# Patient Record
Sex: Female | Born: 1945 | Race: Asian | Hispanic: No | Marital: Married | Smoking: Never smoker
Health system: Southern US, Community
[De-identification: ages and names within clinical notes are randomized; demographics above are authoritative.]

## PROBLEM LIST (undated history)

## (undated) DIAGNOSIS — E119 Type 2 diabetes mellitus without complications: Secondary | ICD-10-CM

## (undated) HISTORY — PX: BREAST BIOPSY: SHX20

## (undated) HISTORY — PX: ABDOMINAL HYSTERECTOMY: SHX81

## (undated) HISTORY — PX: BREAST SURGERY: SHX581

---

## 2009-12-26 ENCOUNTER — Ambulatory Visit: Payer: Self-pay | Admitting: Family Medicine

## 2010-12-22 ENCOUNTER — Inpatient Hospital Stay: Payer: Self-pay | Admitting: Internal Medicine

## 2010-12-31 ENCOUNTER — Ambulatory Visit: Payer: Self-pay | Admitting: Family Medicine

## 2012-01-06 ENCOUNTER — Ambulatory Visit: Payer: Self-pay | Admitting: Family Medicine

## 2012-12-15 ENCOUNTER — Ambulatory Visit: Payer: Self-pay | Admitting: Ophthalmology

## 2012-12-15 LAB — HEMOGLOBIN: HGB: 10.7 g/dL — ABNORMAL LOW (ref 12.0–16.0)

## 2013-01-04 ENCOUNTER — Ambulatory Visit: Payer: Self-pay | Admitting: Ophthalmology

## 2013-02-03 ENCOUNTER — Ambulatory Visit: Payer: Self-pay | Admitting: Ophthalmology

## 2013-02-16 ENCOUNTER — Ambulatory Visit: Payer: Self-pay | Admitting: Ophthalmology

## 2013-04-12 ENCOUNTER — Ambulatory Visit: Payer: Self-pay | Admitting: Family Medicine

## 2014-05-31 ENCOUNTER — Ambulatory Visit: Payer: Self-pay | Admitting: Family Medicine

## 2014-12-22 NOTE — Op Note (Signed)
PATIENT NAME:  Sandra Holmes, Sandra Holmes MR#:  [id number] DATE OF BIRTH:  10/31/1945  DATE OF PROCEDURE:  02/16/2013  PREOPERATIVE DIAGNOSIS: Visually significant cataract of the left eye.   POSTOPERATIVE DIAGNOSIS: Visually significant cataract of the left eye.   OPERATIVE PROCEDURE: Cataract extraction by phacoemulsification with implant of intraocular lens to left eye.   SURGEON: Galen ManilaWilliam Judy Goodenow, MD.   ANESTHESIA:  1. Managed anesthesia care.  2. Topical tetracaine drops followed by 2% Xylocaine jelly applied in the preoperative holding area.   COMPLICATIONS: None.   TECHNIQUE:  Stop-and-chop  DESCRIPTION OF PROCEDURE: The patient was examined and consented in the preoperative holding area where the aforementioned topical anesthesia was applied to the left eye and then brought back to the Operating Room where the left eye was prepped and draped in the usual sterile ophthalmic fashion and a lid speculum was placed. A paracentesis was created with the side port blade and the anterior chamber was filled with viscoelastic. A near clear corneal incision was performed with the steel keratome. A continuous curvilinear capsulorrhexis was performed with a cystotome followed by the capsulorrhexis forceps. Hydrodissection and hydrodelineation were carried out with BSS on a blunt cannula. The lens was removed in a stop-and-chop technique and the remaining cortical material was removed with the irrigation-aspiration handpiece. The capsular bag was inflated with viscoelastic and the ZCB00 20.5-diopter lens, serial number [id number] was placed in the capsular bag without complication. The remaining viscoelastic was removed from the eye with the irrigation-aspiration handpiece. The wounds were hydrated. The anterior chamber was flushed with Miostat and the eye was inflated to physiologic pressure. 0.1 mL of cefuroxime concentration 10 mg/mL was placed in the anterior chamber. The wounds were found to be water tight.  The eye was dressed with Vigamox and Combigan. The patient was given protective glasses to wear throughout the day and a shield with which to sleep tonight. The patient was also given drops with which to begin a drop regimen today and will follow-up with me in one day.    ____________________________ Jerilee FieldWilliam L. Hiilei Gerst, MD wlp:ce D: 02/16/2013 12:27:00 ET T: 02/16/2013 15:33:07 ET JOB#: [id number]  cc: Donavan Kerlin L. Jalissa Heinzelman, MD, <Dictator>  Jerilee FieldWILLIAM L Nakia Koble MD ELECTRONICALLY SIGNED 02/17/2013 14:40

## 2014-12-22 NOTE — Op Note (Signed)
PATIENT NAME:  Sandra Holmes, Sandra Holmes MR#:  [id number] DATE OF BIRTH:  02-21-1946  DATE OF PROCEDURE:  01/04/2013  LOCATION:  [facility].    PREOPERATIVE DIAGNOSIS: Visually significant cataract of the right eye.   POSTOPERATIVE DIAGNOSIS: Visually significant cataract of the right eye.   OPERATIVE PROCEDURE: Cataract extraction by phacoemulsification with implant of intraocular lens to right eye.   SURGEON: Galen ManilaWilliam Fawzi Melman, MD.   ANESTHESIA:  1. Managed anesthesia care.  2. Topical tetracaine drops followed by 2% Xylocaine jelly applied in the preoperative holding area.   COMPLICATIONS: None.   TECHNIQUE:  Stop and chop.   DESCRIPTION OF PROCEDURE: The patient was examined and consented in the preoperative holding area where the aforementioned topical anesthesia was applied to the right eye and then brought back to the Operating Room where the right eye was prepped and draped in the usual sterile ophthalmic fashion and a lid speculum was placed. A paracentesis was created with the side port blade and the anterior chamber was filled with viscoelastic. A near clear corneal incision was performed with the steel keratome. A continuous curvilinear capsulorrhexis was performed with a cystotome followed by the capsulorrhexis forceps. Hydrodissection and hydrodelineation were carried out with BSS on a blunt cannula. The lens was removed in a stop and chop technique and the remaining cortical material was removed with the irrigation-aspiration handpiece. The capsular bag was inflated with viscoelastic and the Tecnis ZCB00 21.5-diopter lens, serial number [id number] was placed in the capsular bag without complication. The remaining viscoelastic was removed from the eye with the irrigation-aspiration handpiece. The wounds were hydrated. The anterior chamber was flushed with Miostat and the eye was inflated to physiologic pressure. 0.1 mL of cefuroxime concentration 10 mg/mL was placed in  the anterior chamber. The wounds were found to be water tight. The eye was dressed with Vigamox. The patient was given protective glasses to wear throughout the day and a shield with which to sleep tonight. The patient was also given drops with which to begin a drop regimen today and will follow-up with me in one day.    ____________________________ Jerilee FieldWilliam L. Leondre Taul, MD wlp:ea D: 01/04/2013 19:31:40 ET T: 01/05/2013 04:45:11 ET JOB#: [id number]  cc: Journee Kohen L. Rubin Dais, MD, <Dictator> Jerilee FieldWILLIAM L Curlie Macken MD ELECTRONICALLY SIGNED 01/10/2013 14:35

## 2015-03-26 ENCOUNTER — Ambulatory Visit
Admission: RE | Admit: 2015-03-26 | Discharge: 2015-03-26 | Disposition: A | Discharge status: Home / Self Care | Payer: Medicare Other | Source: Ambulatory Visit | Attending: Gastroenterology | Admitting: Gastroenterology

## 2015-03-26 ENCOUNTER — Ambulatory Visit: Payer: Medicare Other | Admitting: Anesthesiology

## 2015-03-26 ENCOUNTER — Encounter: Payer: Self-pay | Admitting: *Deleted

## 2015-03-26 ENCOUNTER — Encounter
Admission: RE | Disposition: A | Discharge status: Home / Self Care | Payer: Self-pay | Source: Ambulatory Visit | Attending: Gastroenterology

## 2015-03-26 DIAGNOSIS — E119 Type 2 diabetes mellitus without complications: Secondary | ICD-10-CM | POA: Insufficient documentation

## 2015-03-26 DIAGNOSIS — K648 Other hemorrhoids: Secondary | ICD-10-CM | POA: Insufficient documentation

## 2015-03-26 DIAGNOSIS — Z1211 Encounter for screening for malignant neoplasm of colon: Secondary | ICD-10-CM | POA: Insufficient documentation

## 2015-03-26 HISTORY — PX: COLONOSCOPY WITH PROPOFOL: SHX5780

## 2015-03-26 HISTORY — DX: Type 2 diabetes mellitus without complications: E11.9

## 2015-03-26 LAB — GLUCOSE, CAPILLARY: Glucose-Capillary: 110 mg/dL — ABNORMAL HIGH (ref 65–99)

## 2015-03-26 SURGERY — COLONOSCOPY WITH PROPOFOL
Anesthesia: General

## 2015-03-26 MED ORDER — PROPOFOL INFUSION 10 MG/ML OPTIME
INTRAVENOUS | Status: DC | PRN
  Administered 2015-03-26: 100 ug/kg/min via INTRAVENOUS

## 2015-03-26 MED ORDER — SODIUM CHLORIDE 0.9 % IV SOLN: INTRAVENOUS | Status: DC

## 2015-03-26 MED ORDER — MIDAZOLAM HCL 5 MG/5ML IJ SOLN
INTRAMUSCULAR | Status: DC | PRN
  Administered 2015-03-26: 1 mg via INTRAVENOUS

## 2015-03-26 MED ORDER — SODIUM CHLORIDE 0.9 % IV SOLN
INTRAVENOUS | Status: DC
  Administered 2015-03-26: 1000 mL via INTRAVENOUS

## 2015-03-26 MED ORDER — PROPOFOL 10 MG/ML IV BOLUS
INTRAVENOUS | Status: DC | PRN
  Administered 2015-03-26: 50 mg via INTRAVENOUS
  Administered 2015-03-26: 42 mg via INTRAVENOUS

## 2015-03-26 MED ORDER — FENTANYL CITRATE (PF) 100 MCG/2ML IJ SOLN
INTRAMUSCULAR | Status: DC | PRN
  Administered 2015-03-26: 50 ug via INTRAVENOUS

## 2015-03-26 NOTE — H&P (Signed)
  Primary Care Physician:  Juluis Pitch, MD  Pre-Procedure History & Physical: HPI:  Sandra Holmes is a 69 y.o. female is here for an colonoscopy.   Past Medical History  Diagnosis Date  . Diabetes mellitus without complication     Past Surgical History  Procedure Laterality Date  . Abdominal hysterectomy    . Breast surgery      Prior to Admission medications   Medication Sig Start Date End Date Taking? Authorizing Provider  gabapentin (NEURONTIN) 800 MG tablet Take 800 mg by mouth 3 (three) times daily.   Yes Historical Provider, MD  pioglitazone-metformin (ACTOPLUS MET) 15-850 MG per tablet Take 1 tablet by mouth 2 (two) times daily with a meal.   Yes Historical Provider, MD  simvastatin (ZOCOR) 40 MG tablet Take 40 mg by mouth daily.   Yes Historical Provider, MD  traMADol-acetaminophen (ULTRACET) 37.5-325 MG per tablet Take 1 tablet by mouth every 6 (six) hours as needed.   Yes Historical Provider, MD  vitamin B-12 (CYANOCOBALAMIN) 1000 MCG tablet Take 1,000 mcg by mouth daily.   Yes Historical Provider, MD    Allergies as of 02/20/2015  . (Not on File)    History reviewed. No pertinent family history.  History   Social History  . Marital Status: Married    Spouse Name: N/A  . Number of Children: N/A  . Years of Education: N/A   Occupational History  . Not on file.   Social History Main Topics  . Smoking status: Never Smoker   . Smokeless tobacco: Not on file  . Alcohol Use: No  . Drug Use: No  . Sexual Activity: Not on file   Other Topics Concern  . Not on file   Social History Narrative     Physical Exam: BP 172/65 mmHg  Pulse 92  Temp(Src) 98.4 F (36.9 C) (Tympanic)  Resp 16  Ht $R'5\' 4"'xA$  (1.626 m)  Wt 68.04 kg (150 lb)  BMI 25.73 kg/m2  SpO2 98% General:   Alert,  pleasant and cooperative in NAD Head:  Normocephalic and atraumatic. Neck:  Supple; no masses or thyromegaly. Lungs:  Clear throughout to auscultation.    Heart:  Regular  with occ dropped beats, 2/6 systolic murmur Abdomen:  Soft, nontender and nondistended. Normal bowel sounds, without guarding, and without rebound.   Neurologic:  Alert and  oriented x4;  grossly normal neurologically.  Impression/Plan: [name] is here for an colonoscopy to be performed for screening, avg risk  Risks, benefits, limitations, and alternatives regarding  colonoscopy have been reviewed with the patient.  Questions have been answered.  All parties agreeable.   Josefine Class, MD  03/26/2015, 8:48 AM

## 2015-03-26 NOTE — Transfer of Care (Signed)
Immediate Anesthesia Transfer of Care Note  Patient: Sandra Holmes  Procedure(s) Performed: Procedure(s): COLONOSCOPY WITH PROPOFOL (N/A)  Patient Location: PACU  Anesthesia Type:General  Level of Consciousness: sedated  Airway & Oxygen Therapy: Patient Spontanous Breathing and Patient connected to nasal cannula oxygen  Post-op Assessment: Report given to RN and Post -op Vital signs reviewed and stable  Post vital signs: Reviewed and stable  Last Vitals:  Filed Vitals:   03/26/15 0920  BP: 92/39  Pulse:   Temp: 36 C  Resp: 20    Complications: No apparent anesthesia complications

## 2015-03-26 NOTE — Op Note (Signed)
[facility] Patient Name: Sandra Holmes Procedure Date: 03/26/2015 8:43 AM MRN: [id number] Account #: [id number] Date of Birth: 11-29-1945 Admit Type: Outpatient Age: 69 Room: [facility] ENDO ROOM 2 Gender: Female Note Status: Finalized Procedure:         Colonoscopy Indications:       Screening for colorectal malignant neoplasm, Last                     colonoscopy 10 years ago Patient Profile:   This is a 69 year old female. Providers:         Rhona Raider. Shelle Iron, MD Referring MD:      Teena Irani. Terance Hart, MD (Referring MD) Medicines:         Propofol per Anesthesia Complications:     No immediate complications. Procedure:         Pre-Anesthesia Assessment:                    - Prior to the procedure, a History and Physical was                     performed, and patient medications, allergies and                     sensitivities were reviewed. The patient's tolerance of                     previous anesthesia was reviewed.                    After obtaining informed consent, the colonoscope was                     passed under direct vision. Throughout the procedure, the                     patient's blood pressure, pulse, and oxygen saturations                     were monitored continuously. The Olympus CF-Q160AL                     colonoscope (S#. [id number]) was introduced through the anus                     and advanced to the the cecum, identified by appendiceal                     orifice and ileocecal valve. The colonoscopy was performed                     without difficulty. The patient tolerated the procedure                     well. The quality of the bowel preparation was excellent. Findings:      The perianal and digital rectal examinations were normal.      Internal hemorrhoids were found during retroflexion. The hemorrhoids       were Grade I (internal hemorrhoids that do not prolapse).      The exam was otherwise without  abnormality. Impression:        - Internal hemorrhoids.                    - The examination was otherwise normal.                    -  No specimens collected. Recommendation:    - Observe patient in GI recovery unit.                    - High fiber diet.                    - Continue present medications.                    - Repeat colonoscopy in 10 years for screening purposes.                    - Return to referring physician.                    - The findings and recommendations were discussed with the                     patient.                    - The findings and recommendations were discussed with the                     patient's family. Procedure Code(s): --- Professional ---                    [phone], Colonoscopy, flexible; diagnostic, including                     collection of specimen(s) by brushing or washing, when                     performed (separate procedure) CPT copyright 2014 American Medical Association. All rights reserved. The codes documented in this report are preliminary and upon coder review may  be revised to meet current compliance requirements. Kathalene Frames, MD 03/26/2015 9:17:30 AM This report has been signed electronically. Number of Addenda: 0 Note Initiated On: 03/26/2015 8:43 AM Scope Withdrawal Time: 0 hours 13 minutes 13 seconds  Total Procedure Duration: 0 hours 19 minutes 21 seconds       [facility]

## 2015-03-26 NOTE — Anesthesia Preprocedure Evaluation (Signed)
Anesthesia Evaluation  Patient identified by MRN, date of birth, ID band Patient awake    Reviewed: Allergy & Precautions, NPO status , Patient's Chart, lab work & pertinent test results  Airway Mallampati: II  TM Distance: >3 FB Neck ROM: Full    Dental  (+) Upper Dentures, Lower Dentures   Pulmonary          Cardiovascular + Valvular Problems/Murmurs (murmer, no tx)     Neuro/Psych    GI/Hepatic   Endo/Other  diabetes, Type 2, Oral Hypoglycemic Agents  Renal/GU      Musculoskeletal   Abdominal   Peds  Hematology   Anesthesia Other Findings   Reproductive/Obstetrics                             Anesthesia Physical Anesthesia Plan  ASA: III  Anesthesia Plan: General   Post-op Pain Management:    Induction: Intravenous  Airway Management Planned: Nasal Cannula  Additional Equipment:   Intra-op Plan:   Post-operative Plan:   Informed Consent: I have reviewed the patients History and Physical, chart, labs and discussed the procedure including the risks, benefits and alternatives for the proposed anesthesia with the patient or authorized representative who has indicated his/her understanding and acceptance.     Plan Discussed with:   Anesthesia Plan Comments:         Anesthesia Quick Evaluation

## 2015-03-26 NOTE — Discharge Instructions (Signed)

## 2015-03-26 NOTE — Anesthesia Postprocedure Evaluation (Signed)
  Anesthesia Post-op Note  Patient: Sandra Holmes  Procedure(s) Performed: Procedure(s): COLONOSCOPY WITH PROPOFOL (N/A)  Anesthesia type:General  Patient location: PACU  Post pain: Pain level controlled  Post assessment: Post-op Vital signs reviewed, Patient's Cardiovascular Status Stable, Respiratory Function Stable, Patent Airway and No signs of Nausea or vomiting  Post vital signs: Reviewed and stable  Last Vitals:  Filed Vitals:   03/26/15 0930  BP: 107/46  Pulse: 79  Temp:   Resp: 22    Level of consciousness: awake, alert  and patient cooperative  Complications: No apparent anesthesia complications

## 2015-03-27 ENCOUNTER — Encounter: Payer: Self-pay | Admitting: Gastroenterology

## 2015-07-10 ENCOUNTER — Other Ambulatory Visit: Payer: Self-pay | Admitting: Family Medicine

## 2015-07-10 DIAGNOSIS — Z1231 Encounter for screening mammogram for malignant neoplasm of breast: Secondary | ICD-10-CM

## 2015-07-18 ENCOUNTER — Ambulatory Visit
Admission: RE | Admit: 2015-07-18 | Discharge: 2015-07-18 | Disposition: A | Discharge status: Home / Self Care | Payer: Medicare Other | Source: Ambulatory Visit | Attending: Family Medicine | Admitting: Family Medicine

## 2015-07-18 ENCOUNTER — Other Ambulatory Visit: Payer: Self-pay | Admitting: Family Medicine

## 2015-07-18 DIAGNOSIS — Z1231 Encounter for screening mammogram for malignant neoplasm of breast: Secondary | ICD-10-CM

## 2015-10-16 ENCOUNTER — Inpatient Hospital Stay: Payer: Medicare Other

## 2015-10-16 ENCOUNTER — Inpatient Hospital Stay
Admission: EM | Admit: 2015-10-16 | Discharge: 2015-10-19 | DRG: 494 | Disposition: A | Discharge status: Skilled Nursing Facility | Payer: Medicare Other | Attending: Orthopedic Surgery | Admitting: Orthopedic Surgery

## 2015-10-16 ENCOUNTER — Emergency Department: Payer: Medicare Other

## 2015-10-16 ENCOUNTER — Encounter: Payer: Self-pay | Admitting: Medical Oncology

## 2015-10-16 DIAGNOSIS — Z9889 Other specified postprocedural states: Secondary | ICD-10-CM

## 2015-10-16 DIAGNOSIS — E7800 Pure hypercholesterolemia, unspecified: Secondary | ICD-10-CM | POA: Diagnosis present

## 2015-10-16 DIAGNOSIS — Z7984 Long term (current) use of oral hypoglycemic drugs: Secondary | ICD-10-CM

## 2015-10-16 DIAGNOSIS — D649 Anemia, unspecified: Secondary | ICD-10-CM | POA: Diagnosis present

## 2015-10-16 DIAGNOSIS — Z79899 Other long term (current) drug therapy: Secondary | ICD-10-CM

## 2015-10-16 DIAGNOSIS — E1140 Type 2 diabetes mellitus with diabetic neuropathy, unspecified: Secondary | ICD-10-CM | POA: Diagnosis present

## 2015-10-16 DIAGNOSIS — S82851A Displaced trimalleolar fracture of right lower leg, initial encounter for closed fracture: Principal | ICD-10-CM | POA: Diagnosis present

## 2015-10-16 DIAGNOSIS — Y9289 Other specified places as the place of occurrence of the external cause: Secondary | ICD-10-CM | POA: Diagnosis not present

## 2015-10-16 DIAGNOSIS — I10 Essential (primary) hypertension: Secondary | ICD-10-CM | POA: Diagnosis present

## 2015-10-16 DIAGNOSIS — S82891A Other fracture of right lower leg, initial encounter for closed fracture: Secondary | ICD-10-CM | POA: Diagnosis present

## 2015-10-16 DIAGNOSIS — Z8781 Personal history of (healed) traumatic fracture: Secondary | ICD-10-CM

## 2015-10-16 DIAGNOSIS — W010XXA Fall on same level from slipping, tripping and stumbling without subsequent striking against object, initial encounter: Secondary | ICD-10-CM | POA: Diagnosis present

## 2015-10-16 DIAGNOSIS — S82892A Other fracture of left lower leg, initial encounter for closed fracture: Secondary | ICD-10-CM

## 2015-10-16 LAB — BASIC METABOLIC PANEL
Anion gap: 8 (ref 5–15)
BUN: 15 mg/dL (ref 6–20)
CALCIUM: 9.1 mg/dL (ref 8.9–10.3)
CO2: 30 mmol/L (ref 22–32)
Chloride: 101 mmol/L (ref 101–111)
Creatinine, Ser: 0.87 mg/dL (ref 0.44–1.00)
GFR calc Af Amer: >60 mL/min (ref >60)
GLUCOSE: 124 mg/dL — AB (ref 65–99)
Potassium: 4.9 mmol/L (ref 3.5–5.1)
Sodium: 139 mmol/L (ref 135–145)

## 2015-10-16 LAB — CBC WITH DIFFERENTIAL/PLATELET
BASOS ABS: 0 10*3/uL (ref 0–0.1)
BASOS PCT: 1 %
EOS ABS: 0.7 10*3/uL (ref 0–0.7)
EOS PCT: 10 %
HEMATOCRIT: 30.9 % — AB (ref 35.0–47.0)
Hemoglobin: 9.9 g/dL — ABNORMAL LOW (ref 12.0–16.0)
Lymphocytes Relative: 25 %
Lymphs Abs: 1.6 10*3/uL (ref 1.0–3.6)
MCH: 25.8 pg — ABNORMAL LOW (ref 26.0–34.0)
MCHC: 32.1 g/dL (ref 32.0–36.0)
MCV: 80.4 fL (ref 80.0–100.0)
MONO ABS: 0.5 10*3/uL (ref 0.2–0.9)
MONOS PCT: 7 %
Neutro Abs: 3.7 10*3/uL (ref 1.4–6.5)
Neutrophils Relative %: 57 %
PLATELETS: 283 10*3/uL (ref 150–440)
RBC: 3.84 MIL/uL (ref 3.80–5.20)
RDW: 13.6 % (ref 11.5–14.5)
WBC: 6.5 10*3/uL (ref 3.6–11.0)

## 2015-10-16 LAB — GLUCOSE, CAPILLARY: Glucose-Capillary: 116 mg/dL — ABNORMAL HIGH (ref 65–99)

## 2015-10-16 LAB — MRSA PCR SCREENING: MRSA by PCR: NEGATIVE

## 2015-10-16 MED ORDER — CEFAZOLIN SODIUM-DEXTROSE 2-3 GM-% IV SOLR
2.0000 g | Freq: Once | INTRAVENOUS | Status: AC
  Administered 2015-10-17: 2 g via INTRAVENOUS
  Filled 2015-10-16: qty 50

## 2015-10-16 MED ORDER — GABAPENTIN 300 MG PO CAPS
300.0000 mg | ORAL_CAPSULE | Freq: Three times a day (TID) | ORAL | Status: DC
  Administered 2015-10-16 – 2015-10-19 (×6): 300 mg via ORAL
  Filled 2015-10-16 (×7): qty 1

## 2015-10-16 MED ORDER — SIMVASTATIN 40 MG PO TABS
40.0000 mg | ORAL_TABLET | Freq: Every day | ORAL | Status: DC
  Administered 2015-10-18: 40 mg via ORAL
  Filled 2015-10-16: qty 1

## 2015-10-16 MED ORDER — HYDROCODONE-ACETAMINOPHEN 5-325 MG PO TABS
1.0000 | ORAL_TABLET | Freq: Four times a day (QID) | ORAL | Status: DC | PRN
  Administered 2015-10-16 (×2): 1 via ORAL
  Filled 2015-10-16 (×2): qty 1

## 2015-10-16 MED ORDER — PIOGLITAZONE HCL 30 MG PO TABS
15.0000 mg | ORAL_TABLET | Freq: Two times a day (BID) | ORAL | Status: DC
  Administered 2015-10-18 – 2015-10-19 (×3): 15 mg via ORAL
  Filled 2015-10-16: qty 2
  Filled 2015-10-16 (×2): qty 1

## 2015-10-16 MED ORDER — METFORMIN HCL 850 MG PO TABS
850.0000 mg | ORAL_TABLET | Freq: Two times a day (BID) | ORAL | Status: DC
  Administered 2015-10-18 – 2015-10-19 (×3): 850 mg via ORAL
  Filled 2015-10-16 (×7): qty 1

## 2015-10-16 MED ORDER — HYDROCODONE-ACETAMINOPHEN 5-325 MG PO TABS
1.0000 | ORAL_TABLET | ORAL | Status: DC | PRN
  Administered 2015-10-16: 1 via ORAL
  Administered 2015-10-16: 2 via ORAL
  Administered 2015-10-17: 1 via ORAL
  Administered 2015-10-17: 2 via ORAL
  Administered 2015-10-18 – 2015-10-19 (×3): 1 via ORAL
  Administered 2015-10-19: 2 via ORAL
  Filled 2015-10-16 (×4): qty 1
  Filled 2015-10-16 (×2): qty 2
  Filled 2015-10-16: qty 1
  Filled 2015-10-16: qty 2

## 2015-10-16 MED ORDER — MORPHINE SULFATE (PF) 2 MG/ML IV SOLN
0.5000 mg | INTRAVENOUS | Status: DC | PRN
  Filled 2015-10-16: qty 1

## 2015-10-16 MED ORDER — HYDROMORPHONE HCL 1 MG/ML IJ SOLN
1.0000 mg | Freq: Once | INTRAMUSCULAR | Status: AC
  Administered 2015-10-16: 1 mg via INTRAMUSCULAR
  Filled 2015-10-16: qty 1

## 2015-10-16 MED ORDER — MORPHINE SULFATE (PF) 2 MG/ML IV SOLN
2.0000 mg | INTRAVENOUS | Status: DC | PRN
  Administered 2015-10-16 – 2015-10-18 (×9): 2 mg via INTRAVENOUS
  Filled 2015-10-16 (×8): qty 1

## 2015-10-16 NOTE — ED Provider Notes (Signed)
[facility] Emergency Department Provider Note  ____________________________________________  Time seen: Approximately 8:28 AM  I have reviewed the triage vital signs and the nursing notes.   HISTORY  Chief Complaint Ankle Pain    HPI Sandra Holmes is a 70 y.o. female patient states she tripped over a curb and twisted her ankles. Patient denies any head injury or LOC. Patient also states that the right ankle is worse than the left with obvious edema to the right ankle. Patient rates her pain as a 10 over 10 and describes the pain as "sharp ". No palliative measures taken prior to arrival.   Past Medical History  Diagnosis Date  . Diabetes mellitus without complication ([place])     There are no active problems to display for this patient.   Past Surgical History  Procedure Laterality Date  . Abdominal hysterectomy    . Breast surgery    . Colonoscopy with propofol N/A 03/26/2015    Procedure: COLONOSCOPY WITH PROPOFOL;  Surgeon: Josefine Class, MD;  Location: [facility] ENDOSCOPY;  Service: Endoscopy;  Laterality: N/A;  . Breast biopsy Right 5+ yrs ago    excisional - neg    Current Outpatient Rx  Name  Route  Sig  Dispense  Refill  . gabapentin (NEURONTIN) 800 MG tablet   Oral   Take 800 mg by mouth 3 (three) times daily.         . pioglitazone-metformin (ACTOPLUS MET) 15-850 MG per tablet   Oral   Take 1 tablet by mouth 2 (two) times daily with a meal.         . simvastatin (ZOCOR) 40 MG tablet   Oral   Take 40 mg by mouth daily.         . traMADol-acetaminophen (ULTRACET) 37.5-325 MG per tablet   Oral   Take 1 tablet by mouth every 6 (six) hours as needed.         . vitamin B-12 (CYANOCOBALAMIN) 1000 MCG tablet   Oral   Take 1,000 mcg by mouth daily.           Allergies Review of patient's allergies indicates no known allergies.  Family History  Problem Relation Age of Onset  . Breast cancer Neg Hx     Social  History Social History  Substance Use Topics  . Smoking status: Never Smoker   . Smokeless tobacco: None  . Alcohol Use: No    Review of Systems Constitutional: No fever/chills Eyes: No visual changes. ENT: No sore throat. Cardiovascular: Denies chest pain. Respiratory: Denies shortness of breath. Gastrointestinal: No abdominal pain.  No nausea, no vomiting.  No diarrhea.  No constipation. Genitourinary: Negative for dysuria. Musculoskeletal: Bilateral ankle pain Skin: Negative for rash. Neurological: Negative for headaches, focal weakness or numbness. Endocrine:Diabetes  ____________________________________________   PHYSICAL EXAM:  VITAL SIGNS: ED Triage Vitals  Enc Vitals Group     BP 10/16/15 0821 148/73 mmHg     Pulse Rate 10/16/15 0821 81     Resp 10/16/15 0821 17     Temp 10/16/15 0821 97.6 F (36.4 C)     Temp Source 10/16/15 0821 Oral     SpO2 10/16/15 0821 96 %     Weight 10/16/15 0821 150 lb (68.04 kg)     Height 10/16/15 0821 _0  (1.626 m)     Head Cir --      Peak Flow --      Pain Score 10/16/15 0821 10  Pain Loc --      Pain Edu? --      Excl. in [place]? --     Constitutional: Alert and oriented. Well appearing and in no acute distress. Eyes: Conjunctivae are normal. PERRL. EOMI. Head: Atraumatic. Nose: No congestion/rhinnorhea. Mouth/Throat: Mucous membranes are moist.  Oropharynx non-erythematous. Neck: No stridor.  No cervical spine tenderness to palpation. Hematological/Lymphatic/Immunilogical: No cervical lymphadenopathy. Cardiovascular: Normal rate, regular rhythm. Grossly normal heart sounds.  Good peripheral circulation. Respiratory: Normal respiratory effort.  No retractions. Lungs CTAB. Gastrointestinal: Soft and nontender. No distention. No abdominal bruits. No CVA tenderness. Musculoskeletal: No lower extremity tenderness nor edema.  No joint effusions. Neurologic:  Normal speech and language. No gross focal neurologic deficits are  appreciated. No gait instability. Skin:  Skin is warm, dry and intact. No rash noted. Psychiatric: Mood and affect are normal. Speech and behavior are normal.  ____________________________________________   LABS (all labs ordered are listed, but only abnormal results are displayed)  Labs Reviewed  BASIC METABOLIC PANEL  CBC WITH DIFFERENTIAL/PLATELET   ____________________________________________  EKG   ____________________________________________  RADIOLOGY I, Sable Feil, personally viewed and evaluated these images (plain radiographs) as part of my medical decision making, as well as reviewing the written report by the radiologist. Trimalleolar fracture right ankle. Nondisplaced lateral malleolar fracture left ankle. ____________________________________________   PROCEDURES  Procedure(s) performed: None  Critical Care performed: No  ____________________________________________   INITIAL IMPRESSION / ASSESSMENT AND PLAN / ED COURSE  Pertinent labs & imaging results that were available during my care of the patient were reviewed by me and considered in my medical decision making (see chart for details). Discussed x-ray findings with patient and husband. Discussed with Dr. Rudene Christians on call orthopedics who've admit the patient under his services. ____________________________________________   FINAL CLINICAL IMPRESSION(S) / ED DIAGNOSES  Final diagnoses:  Trimalleolar fracture of right ankle, closed, initial encounter      Sable Feil, PA-C 10/16/15 1004  Lavonia Drafts, MD 10/16/15 [phone]

## 2015-10-16 NOTE — ED Notes (Signed)
Pt reports she tripped over curb and twisted rt ankle. Swelling noted to area. Pt denies hitting head/LOC. Pt also states that he left ankle is painful also.

## 2015-10-16 NOTE — Progress Notes (Signed)
Pt will not take any medications from our pharmacy that is part of her regular medication routine. She will take PRN meds ie pain meds, abx etc. Museum/gallery conservator as well as pharmacy Barbara [facility]) have been made aware.

## 2015-10-16 NOTE — NC FL2 (Signed)
   MEDICAID FL2 LEVEL OF CARE SCREENING TOOL     IDENTIFICATION  Patient Name: Sandra Holmes Birthdate: 12-Apr-1946 Sex: female Admission Date (Current Location): 10/16/2015  [place] and [place] Number:  Chiropodist and Address:  [facility], [address] [place], [place] [address]      Provider Number: [id number]  Attending Physician Name and Address:  Kennedy Bucker, MD  Relative Name and Phone Number:       Current Level of Care: Hospital Recommended Level of Care: Skilled Nursing Facility Prior Approval Number:    Date Approved/Denied:   PASRR Number:  [id number])  Discharge Plan: SNF    Current Diagnoses: Patient Active Problem List   Diagnosis Date Noted  . Bilateral ankle fractures 10/16/2015   Anemia, unspecified 04/13/2015  Hypertension 06/19/2014  Type 2 diabetes mellitus (CMS-HCC) 06/19/2014  Hyperlipidemia, unspecified 06/19/2014  Fibromyalgia      Orientation RESPIRATION BLADDER Height & Weight     Self, Time, Situation, Place  Normal Continent Weight: 150 lb (68.04 kg) Height:   (162.6 cm)  BEHAVIORAL SYMPTOMS/MOOD NEUROLOGICAL BOWEL NUTRITION STATUS   (None )  (none ) Continent Diet (Diet: Card Modified )  AMBULATORY STATUS COMMUNICATION OF NEEDS Skin   Extensive Assist Verbally Normal                       Personal Care Assistance Level of Assistance  Bathing, Feeding, Dressing Bathing Assistance: Limited assistance Feeding assistance: Independent Dressing Assistance: Limited assistance     Functional Limitations Info  Sight, Hearing, Speech Sight Info: Adequate Hearing Info: Adequate Speech Info: Adequate    SPECIAL CARE FACTORS FREQUENCY  PT (By licensed PT), OT (By licensed OT)     PT Frequency:  (5) OT Frequency:  (5)            Contractures      Additional Factors Info  Code Status Code Status Info:  (Full Code. )             Current Medications  (10/16/2015):  This is the current hospital active medication list Current Facility-Administered Medications  Medication Dose Route Frequency Provider Last Rate Last Dose  . HYDROcodone-acetaminophen (NORCO/VICODIN) 5-325 MG per tablet 1-2 tablet  1-2 tablet Oral Q6H PRN Kennedy Bucker, MD   1 tablet at 10/16/15 1429  . morphine 2 MG/ML injection 0.5 mg  0.5 mg Intravenous Q2H PRN Kennedy Bucker, MD         Discharge Medications: Please see discharge summary for a list of discharge medications.  Relevant Imaging Results:  Relevant Lab Results:   Additional Information  (SSN: [id number])  Haig Prophet, LCSW

## 2015-10-16 NOTE — Care Management (Addendum)
Attempted to meet with patient to discuss discharge planning.  Ortho pending. She does not speak good [place] and asked me if I would speak to her husband which is not present. List of home health agencies left with patient. RNCM will continue to follow.

## 2015-10-16 NOTE — H&P (Signed)
Subjective:   Patient is a 70 y.o. female presents with bilateral ankle pain. Onset of symptoms was sudden this morning when she slipped at [facility] coming off a curb      with unchanged course since that time. The pain is located on the outside of the left ankle and the inside and outside of the right ankle. Patient describes the pain as severe continuous and rated as severe. she was evaluated emergency room admitted for treatment of the right trimalleolar ankle fracture, left lateral malleolus fracture is nondisplaced. Past history includesdiabetes treated with oral medication neuropathy hypertension hypercholesterolemia anemia.  Previous studies include ankle x-ray and CT right ankle.  Patient Active Problem List   Diagnosis Date Noted  . Bilateral ankle fractures 10/16/2015   Past Medical History  Diagnosis Date  . Diabetes mellitus without complication [facility])     Past Surgical History  Procedure Laterality Date  . Abdominal hysterectomy    . Breast surgery    . Colonoscopy with propofol N/A 03/26/2015    Procedure: COLONOSCOPY WITH PROPOFOL;  Surgeon: Josefine Class, MD;  Location: [facility] ENDOSCOPY;  Service: Endoscopy;  Laterality: N/A;  . Breast biopsy Right 5+ yrs ago    excisional - neg    Prescriptions prior to admission  Medication Sig Dispense Refill Last Dose  . gabapentin (NEURONTIN) 800 MG tablet Take 800 mg by mouth 4 (four) times daily.    Past Week at Unknown time  . pioglitazone-metformin (ACTOPLUS MET) 15-850 MG per tablet Take 1 tablet by mouth 2 (two) times daily with a meal.   Past Week at Unknown time  . simvastatin (ZOCOR) 40 MG tablet Take 40 mg by mouth at bedtime.    Past Week at Unknown time  . traMADol-acetaminophen (ULTRACET) 37.5-325 MG per tablet Take 1 tablet by mouth every 6 (six) hours as needed.   Past Week at Unknown time  . vitamin B-12 (CYANOCOBALAMIN) 1000 MCG tablet Take 1,000 mcg by mouth daily.   Past Week at Unknown time   No Known Allergies   Social History  Substance Use Topics  . Smoking status: Never Smoker   . Smokeless tobacco: Not on file  . Alcohol Use: No    Family History  Problem Relation Age of Onset  . Breast cancer Neg Hx     Review of Systems Pertinent items are noted in HPI.  Objective:   Patient Vitals for the past 8 hrs:  BP Temp Temp src Pulse Resp SpO2 Height Weight  10/16/15 1513 (!) 119/43 mmHg 99 F (37.2 C) Oral 99 18 94 % - -  10/16/15 1133 (!) 126/41 mmHg 98.2 F (36.8 C) Oral 99 18 96 % - -  10/16/15 1106 130/78 mmHg - - 78 18 98 % - -  10/16/15 0821 (!) 148/73 mmHg 97.6 F (36.4 C) Oral 81 17 96 % 5' 4" (1.626 m) 68.04 kg (150 lb)     Total I/O In: -  Out: 350 [Urine:350]    BP 119/43 mmHg  Pulse 99  Temp(Src) 99 F (37.2 C) (Oral)  Resp 18  Ht 5' 4" (1.626 m)  Wt 68.04 kg (150 lb)  BMI 25.73 kg/m2  SpO2 94% General appearance: alert and cooperative Head: Normocephalic, without obvious abnormality, atraumatic Lungs: clear to auscultation bilaterally Heart: regular rate and rhythm, S1, S2 normal, no murmur, click, rub or gallop Extremities: Swelling in both ankles, more on the right. Skin is intact. She has trace dorsalis pedis and posterior tibial pulses.  Pulses: Trace dorsalis pedis and posterior tibial bilateral  Assessment:   Active Problems:   Bilateral ankle fractures   PPlan nonoperative treatment of the left ankle just with Ace wrap. Right ankle needs ORIF, as there are intra-articular fragments anteromedially though should be addressed at the time of surgery. There is an anterior lateral fragment that probably needs to be left alone. With regard to the right ankle is unstable fracture pattern and will require ORIF plan on doing that tomorrow. Donald Pore was possible, case were discussed with the patient and her husband with her having 2 ankle fractures she will most likely required skilled care postoperatively and have rehabilitation stay after

## 2015-10-17 ENCOUNTER — Encounter: Payer: Self-pay | Admitting: *Deleted

## 2015-10-17 ENCOUNTER — Inpatient Hospital Stay: Payer: Medicare Other

## 2015-10-17 ENCOUNTER — Inpatient Hospital Stay: Payer: Medicare Other | Admitting: Certified Registered Nurse Anesthetist

## 2015-10-17 ENCOUNTER — Encounter
Admission: EM | Disposition: A | Discharge status: Skilled Nursing Facility | Payer: Self-pay | Source: Home / Self Care | Attending: Orthopedic Surgery

## 2015-10-17 HISTORY — PX: ORIF ANKLE FRACTURE: SHX5408

## 2015-10-17 LAB — CBC
HEMATOCRIT: 30.8 % — AB (ref 35.0–47.0)
HEMOGLOBIN: 10.2 g/dL — AB (ref 12.0–16.0)
MCH: 26.8 pg (ref 26.0–34.0)
MCHC: 33.1 g/dL (ref 32.0–36.0)
MCV: 80.9 fL (ref 80.0–100.0)
Platelets: 279 10*3/uL (ref 150–440)
RBC: 3.81 MIL/uL (ref 3.80–5.20)
RDW: 13.2 % (ref 11.5–14.5)
WBC: 9.9 10*3/uL (ref 3.6–11.0)

## 2015-10-17 LAB — GLUCOSE, CAPILLARY
GLUCOSE-CAPILLARY: 156 mg/dL — AB (ref 65–99)
GLUCOSE-CAPILLARY: 126 mg/dL — AB (ref 65–99)
GLUCOSE-CAPILLARY: 161 mg/dL — AB (ref 65–99)
Glucose-Capillary: 147 mg/dL — ABNORMAL HIGH (ref 65–99)

## 2015-10-17 LAB — CREATININE, SERUM: CREATININE: 0.91 mg/dL (ref 0.44–1.00)

## 2015-10-17 SURGERY — OPEN REDUCTION INTERNAL FIXATION (ORIF) ANKLE FRACTURE
Anesthesia: General | Laterality: Right | Wound class: Clean

## 2015-10-17 MED ORDER — SODIUM CHLORIDE 0.9 % IV SOLN
INTRAVENOUS | Status: DC
  Administered 2015-10-17: 14:00:00 via INTRAVENOUS

## 2015-10-17 MED ORDER — FENTANYL CITRATE (PF) 100 MCG/2ML IJ SOLN: 25.0000 ug | INTRAMUSCULAR | Status: DC | PRN

## 2015-10-17 MED ORDER — MAGNESIUM CITRATE PO SOLN
1.0000 | Freq: Once | ORAL | Status: DC | PRN
Start: 2015-10-17 — End: 2015-10-19

## 2015-10-17 MED ORDER — NEOMYCIN-POLYMYXIN B GU 40-200000 IR SOLN
Status: DC | PRN
  Administered 2015-10-17: 4 mL

## 2015-10-17 MED ORDER — ONDANSETRON HCL 4 MG PO TABS: 4.0000 mg | ORAL_TABLET | Freq: Four times a day (QID) | ORAL | Status: DC | PRN

## 2015-10-17 MED ORDER — SUGAMMADEX SODIUM 200 MG/2ML IV SOLN
INTRAVENOUS | Status: DC | PRN
  Administered 2015-10-17: 136 mg via INTRAVENOUS

## 2015-10-17 MED ORDER — ACETAMINOPHEN 325 MG PO TABS: 650.0000 mg | ORAL_TABLET | Freq: Four times a day (QID) | ORAL | Status: DC | PRN

## 2015-10-17 MED ORDER — ZOLPIDEM TARTRATE 5 MG PO TABS: 5.0000 mg | ORAL_TABLET | Freq: Every evening | ORAL | Status: DC | PRN

## 2015-10-17 MED ORDER — MAGNESIUM HYDROXIDE 400 MG/5ML PO SUSP
30.0000 mL | Freq: Every day | ORAL | Status: DC | PRN
  Administered 2015-10-18: 30 mL via ORAL
  Filled 2015-10-17: qty 30

## 2015-10-17 MED ORDER — OXYCODONE HCL 5 MG PO TABS
5.0000 mg | ORAL_TABLET | ORAL | Status: DC | PRN
  Administered 2015-10-18: 10 mg via ORAL
  Administered 2015-10-18 (×2): 5 mg via ORAL
  Administered 2015-10-19: 10 mg via ORAL
  Filled 2015-10-17: qty 2
  Filled 2015-10-17: qty 1
  Filled 2015-10-17: qty 2
  Filled 2015-10-17: qty 1

## 2015-10-17 MED ORDER — FENTANYL CITRATE (PF) 100 MCG/2ML IJ SOLN
INTRAMUSCULAR | Status: DC | PRN
  Administered 2015-10-17: 100 ug via INTRAVENOUS
  Administered 2015-10-17: 50 ug via INTRAVENOUS
  Administered 2015-10-17: 100 ug via INTRAVENOUS

## 2015-10-17 MED ORDER — CEFAZOLIN SODIUM-DEXTROSE 2-3 GM-% IV SOLR
2.0000 g | Freq: Four times a day (QID) | INTRAVENOUS | Status: AC
  Administered 2015-10-17 – 2015-10-18 (×3): 2 g via INTRAVENOUS
  Filled 2015-10-17 (×3): qty 50

## 2015-10-17 MED ORDER — ENOXAPARIN SODIUM 40 MG/0.4ML ~~LOC~~ SOLN
40.0000 mg | SUBCUTANEOUS | Status: DC
  Administered 2015-10-18 – 2015-10-19 (×2): 40 mg via SUBCUTANEOUS
  Filled 2015-10-17 (×2): qty 0.4

## 2015-10-17 MED ORDER — DOCUSATE SODIUM 100 MG PO CAPS
100.0000 mg | ORAL_CAPSULE | Freq: Two times a day (BID) | ORAL | Status: DC
  Administered 2015-10-17 – 2015-10-19 (×4): 100 mg via ORAL
  Filled 2015-10-17 (×4): qty 1

## 2015-10-17 MED ORDER — METHOCARBAMOL 500 MG PO TABS
500.0000 mg | ORAL_TABLET | Freq: Four times a day (QID) | ORAL | Status: DC | PRN
  Administered 2015-10-17 – 2015-10-19 (×3): 500 mg via ORAL
  Filled 2015-10-17 (×3): qty 1

## 2015-10-17 MED ORDER — NEOMYCIN-POLYMYXIN B GU 40-200000 IR SOLN
Status: AC
  Filled 2015-10-17: qty 4

## 2015-10-17 MED ORDER — ROCURONIUM BROMIDE 100 MG/10ML IV SOLN
INTRAVENOUS | Status: DC | PRN
  Administered 2015-10-17: 35 mg via INTRAVENOUS

## 2015-10-17 MED ORDER — BISACODYL 5 MG PO TBEC: 5.0000 mg | DELAYED_RELEASE_TABLET | Freq: Every day | ORAL | Status: DC | PRN

## 2015-10-17 MED ORDER — ONDANSETRON HCL 4 MG/2ML IJ SOLN
4.0000 mg | Freq: Once | INTRAMUSCULAR | Status: AC | PRN
  Administered 2015-10-17: 4 mg via INTRAVENOUS

## 2015-10-17 MED ORDER — PROPOFOL 10 MG/ML IV BOLUS
INTRAVENOUS | Status: DC | PRN
  Administered 2015-10-17: 120 mg via INTRAVENOUS

## 2015-10-17 MED ORDER — ACETAMINOPHEN 650 MG RE SUPP: 650.0000 mg | Freq: Four times a day (QID) | RECTAL | Status: DC | PRN

## 2015-10-17 MED ORDER — METOCLOPRAMIDE HCL 10 MG PO TABS: 5.0000 mg | ORAL_TABLET | Freq: Three times a day (TID) | ORAL | Status: DC | PRN

## 2015-10-17 MED ORDER — ONDANSETRON HCL 4 MG/2ML IJ SOLN: 4.0000 mg | Freq: Four times a day (QID) | INTRAMUSCULAR | Status: DC | PRN

## 2015-10-17 MED ORDER — METOCLOPRAMIDE HCL 5 MG/ML IJ SOLN: 5.0000 mg | Freq: Three times a day (TID) | INTRAMUSCULAR | Status: DC | PRN

## 2015-10-17 MED ORDER — SODIUM CHLORIDE 0.9 % IV SOLN
INTRAVENOUS | Status: DC
  Administered 2015-10-17 – 2015-10-18 (×2): via INTRAVENOUS

## 2015-10-17 MED ORDER — DIPHENHYDRAMINE HCL 12.5 MG/5ML PO ELIX: 12.5000 mg | ORAL_SOLUTION | ORAL | Status: DC | PRN

## 2015-10-17 MED ORDER — LIDOCAINE HCL (CARDIAC) 20 MG/ML IV SOLN
INTRAVENOUS | Status: DC | PRN
  Administered 2015-10-17: 100 mg via INTRAVENOUS

## 2015-10-17 MED ORDER — METHOCARBAMOL 1000 MG/10ML IJ SOLN: 500.0000 mg | Freq: Four times a day (QID) | INTRAVENOUS | Status: DC | PRN

## 2015-10-17 MED ORDER — FENTANYL CITRATE (PF) 100 MCG/2ML IJ SOLN
INTRAMUSCULAR | Status: AC
  Administered 2015-10-17 (×4): 25 ug
  Filled 2015-10-17: qty 2

## 2015-10-17 MED ORDER — DEXAMETHASONE SODIUM PHOSPHATE 4 MG/ML IJ SOLN
INTRAMUSCULAR | Status: DC | PRN
  Administered 2015-10-17: 10 mg via INTRAVENOUS

## 2015-10-17 SURGICAL SUPPLY — 60 items
BANDAGE ACE 4X5 VEL STRL LF (GAUZE/BANDAGES/DRESSINGS) ×6 IMPLANT
BIT DRILL 2.5X2.75 QC CALB (BIT) ×3 IMPLANT
BIT DRILL 3.5X5.5 QC CALB (BIT) ×3 IMPLANT
BIT DRILL CALIBRATED 2.7 (BIT) ×2 IMPLANT
BIT DRILL CALIBRATED 2.7MM (BIT) ×1
BLADE SURG SZ10 CARB STEEL (BLADE) ×6 IMPLANT
BNDG ESMARK 4X12 TAN STRL LF (GAUZE/BANDAGES/DRESSINGS) ×3 IMPLANT
CANISTER SUCT 1200ML W/VALVE (MISCELLANEOUS) ×3 IMPLANT
CHLORAPREP W/TINT 26ML (MISCELLANEOUS) ×3 IMPLANT
DRAPE FLUOR MINI C-ARM 54X84 (DRAPES) ×3 IMPLANT
DRAPE INCISE IOBAN 66X45 STRL (DRAPES) ×3 IMPLANT
DRAPE U-SHAPE 47X51 STRL (DRAPES) ×3 IMPLANT
DRILL SLEEVE 2.7 DIST TIB (TRAUMA) ×2
DRSG EMULSION OIL 3X8 NADH (GAUZE/BANDAGES/DRESSINGS) ×3 IMPLANT
ELECT CAUTERY BLADE 6.4 (BLADE) ×3 IMPLANT
ELECT REM PT RETURN 9FT ADLT (ELECTROSURGICAL) ×3
ELECTRODE REM PT RTRN 9FT ADLT (ELECTROSURGICAL) ×1 IMPLANT
GAUZE PETRO XEROFOAM 1X8 (MISCELLANEOUS) ×3 IMPLANT
GAUZE SPONGE 4X4 12PLY STRL (GAUZE/BANDAGES/DRESSINGS) ×3 IMPLANT
GLOVE BIOGEL PI IND STRL 9 (GLOVE) ×1 IMPLANT
GLOVE BIOGEL PI INDICATOR 9 (GLOVE) ×2
GLOVE INDICATOR 7.5 STRL GRN (GLOVE) ×3 IMPLANT
GLOVE SURG ORTHO 9.0 STRL STRW (GLOVE) ×3 IMPLANT
GOWN SPECIALTY ULTRA XL (MISCELLANEOUS) ×3 IMPLANT
GOWN STRL REUS W/ TWL LRG LVL3 (GOWN DISPOSABLE) ×1 IMPLANT
GOWN STRL REUS W/TWL LRG LVL3 (GOWN DISPOSABLE) ×2
HEMOVAC 400ML (MISCELLANEOUS) ×3
K-WIRE ACE 1.6X6 (WIRE) ×6
KIT DRAIN HEMOVAC JP 7FR 400ML (MISCELLANEOUS) ×1 IMPLANT
KIT RM TURNOVER STRD PROC AR (KITS) ×3 IMPLANT
KWIRE ACE 1.6X6 (WIRE) ×2 IMPLANT
LABEL OR SOLS (LABEL) ×3 IMPLANT
NS IRRIG 1000ML POUR BTL (IV SOLUTION) ×3 IMPLANT
PACK EXTREMITY ARMC (MISCELLANEOUS) ×3 IMPLANT
PAD ABD DERMACEA PRESS 5X9 (GAUZE/BANDAGES/DRESSINGS) ×6 IMPLANT
PAD CAST CTTN 4X4 STRL (SOFTGOODS) ×2 IMPLANT
PAD PREP 24X41 OB/GYN DISP (PERSONAL CARE ITEMS) ×3 IMPLANT
PADDING CAST COTTON 4X4 STRL (SOFTGOODS) ×4
PLATE LOCK 6H 77 BILAT FIB (Plate) ×3 IMPLANT
SCREW ACE CAN 4.0 40M (Screw) ×6 IMPLANT
SCREW CORTICAL 3.5MM  16MM (Screw) ×6 IMPLANT
SCREW CORTICAL 3.5MM 16MM (Screw) ×3 IMPLANT
SCREW LOCK CORT STAR 3.5X14 (Screw) ×3 IMPLANT
SCREW LOCK CORT STAR 3.5X16 (Screw) ×3 IMPLANT
SCREW NON LOCKING LP 3.5 14MM (Screw) ×3 IMPLANT
SCREW NON LOCKING LP 3.5 16MM (Screw) ×3 IMPLANT
SLEEVE DRILL 2.7 DIST TIB (TRAUMA) ×1 IMPLANT
SPLINT CAST 1 STEP 5X30 WHT (MISCELLANEOUS) IMPLANT
SPONGE LAP 18X18 5 PK (GAUZE/BANDAGES/DRESSINGS) ×3 IMPLANT
STAPLER SKIN PROX 35W (STAPLE) ×3 IMPLANT
STOCKINETTE STRL 6IN 960660 (GAUZE/BANDAGES/DRESSINGS) ×3 IMPLANT
SUT ETHILON 3-0 FS-10 30 BLK (SUTURE) ×3
SUT MNCRL AB 4-0 PS2 18 (SUTURE) ×6 IMPLANT
SUT VIC AB 0 CT1 36 (SUTURE) ×3 IMPLANT
SUT VIC AB 2-0 SH 27 (SUTURE) ×4
SUT VIC AB 2-0 SH 27XBRD (SUTURE) ×2 IMPLANT
SUT VIC AB 3-0 SH 27 (SUTURE) ×2
SUT VIC AB 3-0 SH 27X BRD (SUTURE) ×1 IMPLANT
SUTURE EHLN 3-0 FS-10 30 BLK (SUTURE) ×1 IMPLANT
SYRINGE 10CC LL (SYRINGE) ×3 IMPLANT

## 2015-10-17 NOTE — Progress Notes (Signed)
Patient returned from PACU, had episode of vomiting in elevator.  During change of linens patient very lethargic and hard to arouse.  Vital signs showed oxygen saturations 83% on 2 liters.  Temporarily placed on 4 liters of oxygen.   Encouraged patient to cough and deep breathe with incentive spirometer and oxygen level improved to 98-100%.  Oxygen decreased to 2 liters, continued to observe oxygen level after patient fell back asleep and dropped to 90-91% on 2 liters.  Due to state of sedation increased to 3 liters.

## 2015-10-17 NOTE — Progress Notes (Signed)
Oxygen saturations dropped to 84%, encouraged deep breathing and coughing and use of incentive spirometer and improved to 94%.

## 2015-10-17 NOTE — Anesthesia Postprocedure Evaluation (Signed)
Anesthesia Post Note  Patient: Sandra Holmes  Procedure(s) Performed: Procedure(s) (LRB): OPEN REDUCTION INTERNAL FIXATION (ORIF) ANKLE FRACTURE (Right)  Patient location during evaluation: PACU Anesthesia Type: General Level of consciousness: awake and alert Pain management: pain level controlled Vital Signs Assessment: post-procedure vital signs reviewed and stable Respiratory status: spontaneous breathing, nonlabored ventilation, respiratory function stable and patient connected to nasal cannula oxygen Cardiovascular status: blood pressure returned to baseline and stable Postop Assessment: no signs of nausea or vomiting Anesthetic complications: no    Last Vitals:  Filed Vitals:   10/17/15 1726 10/17/15 1756  BP:  125/44  Pulse: 109 107  Temp: 37.1 C 37.2 C  Resp:  18    Last Pain:  Filed Vitals:   10/17/15 1756  PainSc: 5                  Lenard Simmer

## 2015-10-17 NOTE — OR Nursing (Signed)
Dr Rosita Kea notified of O2sat 83-86% on room air - O2 2liter nasal canula initiated. o2 sat 99-100 on oxygen.

## 2015-10-17 NOTE — Progress Notes (Signed)
Xray of right ankle performed in PACU

## 2015-10-17 NOTE — Anesthesia Procedure Notes (Signed)
Procedure Name: Intubation Date/Time: 10/17/2015 2:47 PM Performed by: Shirlee Limerick, Nonnie Pickney Pre-anesthesia Checklist: Patient identified, Emergency Drugs available, Suction available and Patient being monitored Patient Re-evaluated:Patient Re-evaluated prior to inductionOxygen Delivery Method: Circle system utilized Preoxygenation: Pre-oxygenation with 100% oxygen Intubation Type: IV induction Laryngoscope Size: Mac and 3 Grade View: Grade I Tube type: Oral Tube size: 7.0 mm Number of attempts: 1 Placement Confirmation: ETT inserted through vocal cords under direct vision,  positive ETCO2 and breath sounds checked- equal and bilateral Secured at: 20 cm Tube secured with: Tape Dental Injury: Teeth and Oropharynx as per pre-operative assessment

## 2015-10-17 NOTE — Transfer of Care (Signed)
Immediate Anesthesia Transfer of Care Note  Patient: Sandra Holmes  Procedure(s) Performed: Procedure(s): OPEN REDUCTION INTERNAL FIXATION (ORIF) ANKLE FRACTURE (Right)  Patient Location: PACU  Anesthesia Type:General  Level of Consciousness: sedated  Airway & Oxygen Therapy: Patient Spontanous Breathing and Patient connected to face mask oxygen  Post-op Assessment: Report given to RN and Post -op Vital signs reviewed and stable  Post vital signs: Reviewed and stable  Last Vitals:  Filed Vitals:   10/17/15 1335 10/17/15 1407  BP: 144/55 144/60  Pulse: 103   Temp: 38.2 C   Resp: 16     Complications: No apparent anesthesia complications

## 2015-10-17 NOTE — Progress Notes (Signed)
Patient refuses to take any hospital medications unless as needed meds for pain control or antibiotics.  Patient NPO for surgery.  As previously stated in notes patient adament about using own home medications and refuses to take routine medications here, has her home pills on side table in room.  Previous nurse [facility] and pharmacy of issue.  Explained to patient do not take any of her home medications today until after surgery.

## 2015-10-17 NOTE — Op Note (Signed)
10/16/2015 - 10/17/2015  3:54 PM  PATIENT:  Sandra Holmes  70 y.o. female  PRE-OPERATIVE DIAGNOSIS:  right ankle fracture trimalleolar  POST-OPERATIVE DIAGNOSIS:  right ankle fracture trimalleolar  PROCEDURE:  Procedure(s): OPEN REDUCTION INTERNAL FIXATION (ORIF) ANKLE FRACTURE (Right) medial and lateral malleoli   SURGEON: Leitha Schuller, MD  ASSISTANTS: none ANESTHESIA:   general  EBL:  Total I/O In: -  Out: 175 [Urine:175]  BLOOD ADMINISTERED:none  DRAINS: none   LOCAL MEDICATIONS USED:  NONE  SPECIMEN:  No Specimen  DISPOSITION OF SPECIMEN:  N/A  COUNTS:  YES  TOURNIQUET:   Total Tourniquet Time Documented: Thigh (Right) - 43 minutes Total: Thigh (Right) - 43 minutes   IMPLANTS: Biomet composite locking plate distal fibula multiple screws, 4.0 cannulated screws medially 240 mm   DICTATION: .Dragon Dictation  Patient brought the operating room and after adequate anesthesia was obtained a tourniquet was applied to the upper thigh and a bump to internally rotate the right leg. After prepping and draping in usual sterile fashion, appropriate patient identification and timeout procedures were completed. Tourniquet was raised and a lateral approach is made to the distal fibula. The fracture site was exposed and with a reduction clamp anatomic alignment obtained. The anterior to posterior lag screw was inserted over drilling the proximal cortex. Reduction clamp could be removed and the fracture was essentially anatomic aligned. A 6 hole composite plate was then contoured to fit the distal fibula 3 proximal cortical screws were placed and 2 distal locking screws. This gave essentially anatomic alignment of the fibula in both AP and lateral projections. Going medially and anterior medial approach is made in the joint opened the joint was thoroughly irrigated to remove small bone fragments. Fracture was held in a reduced position and 2 K wires were inserted and 2 40 mm  malleolar screws were inserted over the guidewires getting good compression at the fracture site. Fluoroscopic examination showed anatomic alignment with stable fixation, stable syndesmosis to stress views. The wounds were irrigated and closed with 2-0 Vicryl substantially flow by skin staples. Xeroform 4 x 4's ABDs and web roll and a stirrup splint applied   Plan OF CARE: Continue as inpatient  PATIENT DISPOSITION:  PACU - hemodynamically stable.

## 2015-10-17 NOTE — Anesthesia Preprocedure Evaluation (Signed)
Anesthesia Evaluation  Patient identified by MRN, date of birth, ID band Patient awake    Reviewed: Allergy & Precautions, H&P , NPO status , Patient's Chart, lab work & pertinent test results, reviewed documented beta blocker date and time   History of Anesthesia Complications Negative for: history of anesthetic complications  Airway Mallampati: I  TM Distance: >3 FB Neck ROM: full    Dental no notable dental hx. (+) Edentulous Upper, Edentulous Lower   Pulmonary neg shortness of breath, neg sleep apnea, neg COPD, neg recent URI,  Baseline saturation is 88-90%   Pulmonary exam normal breath sounds clear to auscultation       Cardiovascular Exercise Tolerance: Good (-) angina(-) CAD, (-) Past MI, (-) Cardiac Stents and (-) CABG Normal cardiovascular exam(-) dysrhythmias + Valvular Problems/Murmurs  Rhythm:regular Rate:Normal     Neuro/Psych PSYCHIATRIC DISORDERS (fibromyalgia) negative neurological ROS     GI/Hepatic negative GI ROS, Neg liver ROS,   Endo/Other  diabetes  Renal/GU negative Renal ROS  negative genitourinary   Musculoskeletal   Abdominal   Peds  Hematology negative hematology ROS (+)   Anesthesia Other Findings Past Medical History:   Diabetes mellitus without complication (HCC)                 Reproductive/Obstetrics negative OB ROS                             Anesthesia Physical Anesthesia Plan  ASA: II  Anesthesia Plan: General   Post-op Pain Management:    Induction:   Airway Management Planned:   Additional Equipment:   Intra-op Plan:   Post-operative Plan:   Informed Consent: I have reviewed the patients History and Physical, chart, labs and discussed the procedure including the risks, benefits and alternatives for the proposed anesthesia with the patient or authorized representative who has indicated his/her understanding and acceptance.   Dental  Advisory Given  Plan Discussed with: Anesthesiologist, CRNA and Surgeon  Anesthesia Plan Comments:         Anesthesia Quick Evaluation

## 2015-10-17 NOTE — Care Management Note (Addendum)
Case Management Note  Patient Details  Name: Sandra Holmes MRN: [id number] Date of Birth: 1945-10-26  Subjective/Objective:                  Attempted again to meet with patient to discuss discharge planning- husband was not in room. I left message at number on patient's white board to call this RNCM back to discuss discharge needs. Patient does not want to talk to CM without husband being present.  Action/Plan:  List of home health providers and my contact card left with patient. RNCM will continue to follow.   Expected Discharge Date:                  Expected Discharge Plan:     In-House Referral:     Discharge planning Services     Post Acute Care Choice:  Durable Medical Equipment, Home Health Choice offered to:  Patient, Spouse  DME Arranged:    DME Agency:     HH Arranged:    [place] Agency:     Status of Service:  In process, will continue to follow  Medicare Important Message Given:    Date Medicare IM Given:    Medicare IM give by:    Date Additional Medicare IM Given:    Additional Medicare Important Message give by:     If discussed at [place] of Stay Meetings, dates discussed:    Additional Comments: 10/18/15: met with patient's husband and he agrees to SNF in the [facility] area. List of home health providers shared with him also. She does not have any DME available at home. She uses [facility] on [address] for Rx. PT is recommending SNF.   Marshell Garfinkel, RN 10/17/2015, 11:14 AM

## 2015-10-18 LAB — GLUCOSE, CAPILLARY
GLUCOSE-CAPILLARY: 135 mg/dL — AB (ref 65–99)
GLUCOSE-CAPILLARY: 145 mg/dL — AB (ref 65–99)
GLUCOSE-CAPILLARY: 115 mg/dL — AB (ref 65–99)

## 2015-10-18 MED ORDER — TRAMADOL-ACETAMINOPHEN 37.5-325 MG PO TABS: 1.0000 | ORAL_TABLET | Freq: Four times a day (QID) | ORAL | Status: AC | PRN

## 2015-10-18 MED ORDER — OXYCODONE HCL ER 10 MG PO T12A: 10.0000 mg | EXTENDED_RELEASE_TABLET | Freq: Two times a day (BID) | ORAL | Status: AC

## 2015-10-18 MED ORDER — OXYCODONE HCL ER 10 MG PO T12A
10.0000 mg | EXTENDED_RELEASE_TABLET | Freq: Two times a day (BID) | ORAL | Status: DC
  Administered 2015-10-18 – 2015-10-19 (×2): 10 mg via ORAL
  Filled 2015-10-18 (×2): qty 1

## 2015-10-18 MED ORDER — OXYCODONE HCL 5 MG PO TABS: 5.0000 mg | ORAL_TABLET | ORAL | Status: AC | PRN

## 2015-10-18 MED ORDER — ASPIRIN EC 325 MG PO TBEC: 325.0000 mg | DELAYED_RELEASE_TABLET | Freq: Every day | ORAL | Status: AC

## 2015-10-18 NOTE — Discharge Instructions (Signed)
Diet: As you were doing prior to hospitalization   Shower: Keep cast clean and dry.   Activity:  Increase activity slowly as tolerated, but follow the weight bearing instructions below.  No lifting or driving for 6 weeks.  Weight Bearing:   Weight bearing as tolerated to left lower extremity. Non weight bearing right lower extremity.  To prevent constipation: you may use a stool softener such as -  Colace (over the counter) 100 mg by mouth twice a day  Drink plenty of fluids (prune juice may be helpful) and high fiber foods Miralax (over the counter) for constipation as needed.    Itching:  If you experience itching with your medications, try taking only a single pain pill, or even half a pain pill at a time.  You may take up to 10 pain pills per day, and you can also use benadryl over the counter for itching or also to help with sleep.   Precautions:  If you experience chest pain or shortness of breath - call 911 immediately for transfer to the hospital emergency department!!  If you develop a fever greater that 101 F, purulent drainage from wound, increased redness or drainage from wound, or calf pain-Call [facility]                                               Follow- Up Appointment:  Please call for an appointment to be seen in 12 days at [facility]

## 2015-10-18 NOTE — Clinical Social Work Placement (Signed)
   CLINICAL SOCIAL WORK PLACEMENT  NOTE  Date:  10/18/2015  Patient Details  Name: Sandra Holmes MRN: [id number] Date of Birth: Dec 25, 1945  Clinical Social Work is seeking post-discharge placement for this patient at the Skilled  Nursing Facility level of care (*CSW will initial, date and re-position this form in  chart as items are completed):  Yes   Patient/family provided with [facility] Clinical Social Work Department's list of facilities offering this level of care within the geographic area requested by the patient (or if unable, by the patient's family).  Yes   Patient/family informed of their freedom to choose among providers that offer the needed level of care, that participate in Medicare, Medicaid or managed care program needed by the patient, have an available bed and are willing to accept the patient.  Yes   Patient/family informed of [facility]'s ownership interest in [facility] and [facility], as well as of the fact that they are under no obligation to receive care at these facilities.  PASRR submitted to EDS on 10/18/15     PASRR number received on 10/18/15     Existing PASRR number confirmed on       FL2 transmitted to all facilities in geographic area requested by pt/family on 10/18/15     FL2 transmitted to all facilities within larger geographic area on       Patient informed that his/her managed care company has contracts with or will negotiate with certain facilities, including the following:        Yes   Patient/family informed of bed offers received.  Patient chooses bed at       Physician recommends and patient chooses bed at      Patient to be transferred to   on  .  Patient to be transferred to facility by       Patient family notified on   of transfer.  Name of family member notified:        PHYSICIAN       Additional Comment:    _______________________________________________ Haig Prophet, LCSW 10/18/2015, 9:46 AM

## 2015-10-18 NOTE — Progress Notes (Signed)
Clinical Education officer, museum (CSW) met with patient and presented bed offers. Patient chose [facility]. CSW left patient's husband Alvester Chou a voicemail making him aware of above. Plan is for patient to D/C to [facility] tomorrow (10/19/15) pending medical clearance. [facility] admissions coordinator at [facility] is aware of above. CSW will continue to follow and assist as needed.   Blima Rich, LCSW [phone]

## 2015-10-18 NOTE — Clinical Social Work Note (Signed)
Clinical Social Work Assessment  Patient Details  Name: Sandra Holmes MRN: [id number] Date of Birth: May 19, 1946  Date of referral:  10/18/15               Reason for consult:  Facility Placement                Permission sought to share information with:  Chartered certified accountant granted to share information::  Yes, Verbal Permission Granted  Name::      [place]::   [place]   Relationship::     Contact Information:     Housing/Transportation Living arrangements for the past 2 months:  [place] of Information:  Patient, Spouse Patient Interpreter Needed:  None Criminal Activity/Legal Involvement Pertinent to Current Situation/Hospitalization:  No - Comment as needed Significant Relationships:  Spouse Lives with:  Spouse Do you feel safe going back to the place where you live?  Yes Need for family participation in patient care:  Yes (Comment)  Care giving concerns:  Patient lives in [place] with her husband Sandra Holmes home phone [phone] patient's cell phone [phone].    Social Worker assessment / plan:  Holiday representative (CSW) received SNF consult. PT is recommending SNF. CSW met with patient and her husband Sandra Holmes was at bedside. Patient was alert and oriented and sitting up in the chair. CSW introduced self and explained role of CSW department. Patient can speak and understand english. Patient and husband did speak another language to each other at times throughout assessment. Per patient she lives with her husband in [place]. Patient reported that they have no children. Per patient she sustained her injury at [facility] due to stepping off the curb and not being aware of the curb. Patient reported that her husband's cousin just happen to be standing behind her and caught her head before it hit the ground. Patient was in good spirits today and was optimistic about her recovery. CSW explained  SNF process. Patient and husband are agreeable to SNF search in [place]. SNF list was provided. Husband prefers [facility] [employer] or [facility]. CSW explained Medicare guidelines for SNF and that Medicare will pay for days 1-20 at 100% at SNF. Patient reported that she is not planning on staying 20 days at [facility].   FL2 complete and faxed out. CSW will continue to follow and assist as needed.   Employment status:  Retired Forensic scientist:  Medicare PT Recommendations:  [place] / Referral to community resources:  [place]  Patient/Family's Response to care:  Patient and husband are agreeable to [facility] in [place].   Patient/Family's Understanding of and Emotional Response to Diagnosis, Current Treatment, and Prognosis:  Patient and husband were pleasant throughout assessment and thanked CSW for visit.   Emotional Assessment Appearance:  Appears stated age Attitude/Demeanor/Rapport:    Affect (typically observed):  Accepting, Adaptable, Pleasant Orientation:  Oriented to Self, Oriented to Place, Oriented to  Time, Oriented to Situation Alcohol / Substance use:  Not Applicable Psych involvement (Current and /or in the community):  No (Comment)  Discharge Needs  Concerns to be addressed:  Discharge Planning Concerns Readmission within the last 30 days:  No Current discharge risk:  Dependent with Mobility Barriers to Discharge:  Continued Medical Work up   Loralyn Freshwater, LCSW 10/18/2015, 9:48 AM

## 2015-10-18 NOTE — Evaluation (Signed)
Physical Therapy Evaluation Patient Details Name: ANALLELI GIERKE MRN: [id number] DOB: [birth year] Today's Date: 10/18/2015   History of Present Illness  Patient is a 70 y.o. female presents with bilateral ankle pain. Onset of symptoms was sudden this morning when she slipped at [facility] coming off a curb     with unchanged course since that time. The pain is located on the outside of the left ankle and the inside and outside of the right ankle. Patient describes the pain as severe continuous and rated as severe. she was evaluated emergency room admitted for treatment of the right trimalleolar ankle fracture, left lateral malleolus fracture is nondisplaced. Past history includesdiabetes treated with oral medication neuropathy hypertension hypercholesterolemia anemia. Previous studies include ankle x-ray and CT right ankle.  Clinical Impression  Pt demonstrates severe limitations in mobility due to weakness and pain. She is WBAT on LLE which allows for transfers and ambulation however very limited today due to pain. Pt is very motivated and eager to get better. She is able to complete all bed exercises as instructed and is able to transfer to recliner. Pt will need SNF placement at discharge in order to return to prior level of function. Pt will benefit from skilled PT services to address deficits in strength, balance, and mobility in order to return to full function at home.     Follow Up Recommendations SNF    Equipment Recommendations  Rolling walker with 5" wheels (TBD at SNF)    Recommendations for Other Services Rehab consult     Precautions / Restrictions Precautions Precautions: Fall Restrictions Weight Bearing Restrictions: Yes RLE Weight Bearing: Non weight bearing LLE Weight Bearing: Weight bearing as tolerated      Mobility  Bed Mobility Overal bed mobility: Needs Assistance Bed Mobility: Supine to Sit     Supine to sit: Mod assist     General bed mobility comments:  Pt requires cues and instruction for proper sequencing with bed mobility. Assist to scoot forward to EOB  Transfers Overall transfer level: Needs assistance Equipment used: Rolling walker (2 wheeled) Transfers: Sit to/from Stand Sit to Stand: Mod assist;+2 physical assistance         General transfer comment: Initially pt performs with difficulty straigthening LLE and L foot sliding forward. Repeated transfer second time with repeated cues and foot to block patien'ts leg from sliding. Improved transfer during second attempt. Once upright able to decrease assist to minA+2. Pt able to perform limited hopping in place with WBAT on LLE and NWB RLE. Pt with increase in LLE pain.  Ambulation/Gait Ambulation/Gait assistance: Mod assist;+2 physical assistance;Max assist Ambulation Distance (Feet): 2 Feet Assistive device: Rolling walker (2 wheeled)   Gait velocity: Decreased   General Gait Details: Pt with hopping gait pattern on LLE while maintaining NWB RLE. Initially modA+2 for 2' but increases to maxA+2 with quick redirection toward recliner. Pt is WBAT on LLE but with increased pain in standing. Very limited in ambulation at this time. SaO2 >90% on room air during transfer. Pt placed back on supplemental O2 but weaned down to 2L/min  Stairs            Wheelchair Mobility    Modified Rankin (Stroke Patients Only)       Balance Overall balance assessment: Needs assistance Sitting-balance support: No upper extremity supported Sitting balance-Leahy Scale: Good     Standing balance support: Bilateral upper extremity supported Standing balance-Leahy Scale: Poor  Pertinent Vitals/Pain Pain Assessment: 0-10 Pain Score: 7  Pain Location: Right ankle, 0/10 L ankle at rest. Bilateral ankle pain increases with mobility Pain Intervention(s): Monitored during session;Limited activity within patient's tolerance;RN gave pain meds during session     Home Living Family/patient expects to be discharged to:: Unsure Living Arrangements: Spouse/significant other Available Help at Discharge: Family Type of Home: House Home Access: Stairs to enter Entrance Stairs-Rails: Left Entrance Stairs-Number of Steps: 5 Home Layout: Two level;Able to live on main level with bedroom/bathroom Home Equipment: Cane - single point (no walker, crutches, shower seat, wheelchair, or hospitalbed)      Prior Function Level of Independence: Independent         Comments: Fully independent for community ambulation without assistive device     Hand Dominance   Dominant Hand: Right    Extremity/Trunk Assessment   Upper Extremity Assessment: Overall WFL for tasks assessed           Lower Extremity Assessment: RLE deficits/detail;LLE deficits/detail RLE Deficits / Details: RLE immobilized in splint. LLE immobilized with ace wrap. Pt able to flex extend both toes with intact sensation to light touch and good capillary refill. Pt able to perform bilateral SLR and LAQ but with increased pain. Requires assist for bilateral LE abduction/adduction due to increased pain       Communication   Communication: Prefers language other than English (Speaks adequate English for evaluation but not native)  Cognition Arousal/Alertness: Awake/alert Behavior During Therapy: WFL for tasks assessed/performed Overall Cognitive Status: Within Functional Limits for tasks assessed                      General Comments      Exercises General Exercises - Lower Extremity Quad Sets: Strengthening;Both;10 reps;Supine Gluteal Sets: Strengthening;Both;10 reps;Supine Short Arc Quad: Strengthening;Both;10 reps;Supine Hip ABduction/ADduction: Strengthening;Both;10 reps;Supine Straight Leg Raises: Strengthening;Both;10 reps;Supine      Assessment/Plan    PT Assessment Patient needs continued PT services  PT Diagnosis Difficulty walking;Abnormality of  gait;Generalized weakness;Acute pain   PT Problem List Decreased strength;Decreased range of motion;Decreased activity tolerance;Decreased balance;Decreased mobility;Decreased knowledge of use of DME;Decreased safety awareness;Pain  PT Treatment Interventions DME instruction;Gait training;Stair training;Functional mobility training;Therapeutic activities;Therapeutic exercise;Balance training;Neuromuscular re-education;Patient/family education;Manual techniques   PT Goals (Current goals can be found in the Care Plan section) Acute Rehab PT Goals Patient Stated Goal: Return to prior level of function at home PT Goal Formulation: With patient/family Time For Goal Achievement: 11/01/15 Potential to Achieve Goals: Good    Frequency BID   Barriers to discharge Inaccessible home environment Pt with 5 steps to enter home    Co-evaluation               End of Session Equipment Utilized During Treatment: Gait belt Activity Tolerance: Patient tolerated treatment well Patient left: in chair;with call bell/phone within reach;with chair alarm set           Time: 4098-1191 PT Time Calculation (min) (ACUTE ONLY): 40 min   Charges:   PT Evaluation $PT Eval Moderate Complexity: 1 Procedure PT Treatments $Therapeutic Exercise: 8-22 mins   PT G Codes:       Sharalyn Ink Huprich PT, DPT   Huprich,Jason 10/18/2015, 11:38 AM

## 2015-10-18 NOTE — Progress Notes (Signed)
   Subjective: 1 Day Post-Op Procedure(s) (LRB): OPEN REDUCTION INTERNAL FIXATION (ORIF) ANKLE FRACTURE (Right) Patient reports pain as mild.   Patient is well, and has had no acute complaints or problems Denies any CP, SOB, ABD pain. We will continue therapy today.  Plan is to go Skilled nursing facility after hospital stay.  Objective: Vital signs in last 24 hours: Temp:  [98 F (36.7 C)-100.8 F (38.2 C)] 98 F (36.7 C) (02/16 0232) Pulse Rate:  [88-110] 99 (02/16 0232) Resp:  [15-19] 18 (02/16 0232) BP: (120-150)/(42-73) 131/54 mmHg (02/16 0232) SpO2:  [83 %-100 %] 93 % (02/16 0232) Weight:  [68.04 kg (150 lb)] 68.04 kg (150 lb) (02/15 1335)  Intake/Output from previous day: 02/15 0701 - 02/16 0700 In: 850 [I.V.:850] Out: 1035 [Urine:1025; Blood:10] Intake/Output this shift:     Recent Labs  10/16/15 0959 10/17/15 1859  HGB 9.9* 10.2*    Recent Labs  10/16/15 0959 10/17/15 1859  WBC 6.5 9.9  RBC 3.84 3.81  HCT 30.9* 30.8*  PLT 283 279    Recent Labs  10/16/15 0959 10/17/15 1859  NA 139  --   K 4.9  --   CL 101  --   CO2 30  --   BUN 15  --   CREATININE 0.87 0.91  GLUCOSE 124*  --   CALCIUM 9.1  --    No results for input(s): LABPT, INR in the last 72 hours.  EXAM General - Patient is Alert, Appropriate and Oriented Extremity - Neurovascular intact Sensation intact distally Intact pulses distally Dressing - dressing C/D/I and no drainage Motor Function - intact, moving toes well on exam.   Past Medical History  Diagnosis Date  . Diabetes mellitus without complication (HCC)     Assessment/Plan:   1 Day Post-Op Procedure(s) (LRB): OPEN REDUCTION INTERNAL FIXATION (ORIF) ANKLE FRACTURE (Right) Active Problems:   Bilateral ankle fractures  Estimated body mass index is 25.73 kg/(m^2) as calculated from the following:   Height as of this encounter:  (1.626 m).   Weight as of this encounter: 68.04 kg (150 lb). Advance diet Up with  therapy, NWB RLE, WBAT LLE   DVT Prophylaxis - Lovenox Weight-Bearing as tolerated to left leg, NWB RLE D/C O2 and Pulse OX and try on Room Air  T. Cranston Neighbor, PA-C [facility] 10/18/2015, 7:32 AM

## 2015-10-18 NOTE — Discharge Summary (Signed)
Physician Discharge Summary  Patient ID: Sandra Holmes MRN: [id number] DOB/AGE: 68-12-1945 68 y.o.  Admit date: 10/16/2015 Discharge date: 10/19/2015  Admission Diagnoses:  Hypertension [I10] Trimalleolar fracture of right ankle, closed, initial encounter [S82.851A]   Discharge Diagnoses: Patient Active Problem List   Diagnosis Date Noted  . Bilateral ankle fractures 10/16/2015    Past Medical History  Diagnosis Date  . Diabetes mellitus without complication ([place])      Transfusion: none   Consultants (if any):    Discharged Condition: Improved  Hospital Course: Sandra Holmes is an 70 y.o. female who was admitted 10/16/2015 with a diagnosis of bilateral ankle fractures and went to the operating room on 10/16/2015 - 10/17/2015 and underwent the above named procedures.    Surgeries: Procedure(s): OPEN REDUCTION INTERNAL FIXATION (ORIF) ANKLE FRACTURE on 10/16/2015 - 10/17/2015 Patient tolerated the surgery well. Taken to PACU where she was stabilized and then transferred to the orthopedic floor.  Started on Lovenox 40 q 24 hrs. Foot pumps applied bilaterally at 80 mm. Heels elevated on bed with rolled towels. No evidence of DVT. Negative Homan. Physical therapy started on day #1 for gait training and transfer. OT started day #1 for ADL and assisted devices.  Patient's foley was d/c on day #1. On post op day #2 patient was placed into a right short leg cast. ACE wrap was applied to the left ankle. Patient is NWB RLE, WBAT LLE.  On post op day #2 patient was stable and ready for discharge to SNF.  Implants: Biomet composite locking plate distal fibula multiple screws, 4.0 cannulated screws medially 240 mm  She was given perioperative antibiotics:  Anti-infectives    Start     Dose/Rate Route Frequency Ordered Stop   10/17/15 2100  ceFAZolin (ANCEF) IVPB 2 g/50 mL premix     2 g 100 mL/hr over 30 Minutes Intravenous Every 6 hours 10/17/15 1713 10/18/15 0917   10/17/15  1400  [MAR Hold]  ceFAZolin (ANCEF) IVPB 2 g/50 mL premix     (MAR Hold since 10/17/15 1332)   2 g 100 mL/hr over 30 Minutes Intravenous  Once 10/16/15 1542 10/17/15 1502    .  She was given sequential compression devices, early ambulation, and aspirin for DVT prophylaxis.  She benefited maximally from the hospital stay and there were no complications.    Recent vital signs:  Filed Vitals:   10/18/15 1619 10/18/15 1930  BP: 106/39 120/39  Pulse: 101 96  Temp:  98.1 F (36.7 C)  Resp:  21    Recent laboratory studies:  Lab Results  Component Value Date   HGB 10.2* 10/17/2015   HGB 9.9* 10/16/2015   HGB 10.3* 02/03/2013   Lab Results  Component Value Date   WBC 9.9 10/17/2015   PLT 279 10/17/2015   No results found for: INR Lab Results  Component Value Date   NA 139 10/16/2015   K 4.9 10/16/2015   CL 101 10/16/2015   CO2 30 10/16/2015   BUN 15 10/16/2015   CREATININE 0.91 10/17/2015   GLUCOSE 124* 10/16/2015    Discharge Medications:     Medication List    TAKE these medications        ferrous gluconate 324 MG tablet  Commonly known as:  FERGON  Take 324 mg by mouth daily.     gabapentin 800 MG tablet  Commonly known as:  NEURONTIN  Take 800 mg by mouth 4 (four) times daily.  oxyCODONE 10 mg 12 hr tablet  Commonly known as:  OXYCONTIN  Take 1 tablet (10 mg total) by mouth every 12 (twelve) hours.     oxyCODONE 5 MG immediate release tablet  Commonly known as:  Oxy IR/ROXICODONE  Take 1-2 tablets (5-10 mg total) by mouth every 3 (three) hours as needed for breakthrough pain.     pioglitazone-metformin 15-850 MG tablet  Commonly known as:  ACTOPLUS MET  Take 1 tablet by mouth 2 (two) times daily with a meal.     simvastatin 40 MG tablet  Commonly known as:  ZOCOR  Take 40 mg by mouth at bedtime.     traMADol-acetaminophen 37.5-325 MG tablet  Commonly known as:  ULTRACET  Take 1 tablet by mouth every 6 (six) hours as needed.     vitamin B-12  1000 MCG tablet  Commonly known as:  CYANOCOBALAMIN  Take 1,000 mcg by mouth daily.        Diagnostic Studies: Dg Ankle 2 Views Right  11-13-15  CLINICAL DATA:  Status post open reduction internal fixation right ankle fracture EXAM: RIGHT ANKLE - 2 VIEW COMPARISON:  10/16/2015 FINDINGS: Two views of the right ankle submitted. The patient is status post open reduction internal fixation of trimalleolar ankle fracture. A metallic fixation plate and metallic fixation screws are noted in in distal fibula. Two metallic fixation screws are noted in and distal tibia. There is anatomic alignment. Lateral and medial skin staples are noted. There is casting material artifact. Ankle mortise is restored. IMPRESSION: The patient is status post open reduction internal fixation of trimalleolar ankle fracture. A metallic fixation plate and metallic fixation screws are noted in in distal fibula. Two metallic fixation screws are noted in and distal tibia. There is anatomic alignment. Lateral and medial skin staples are noted. Electronically Signed   By: Lahoma Crocker M.D.   On: Nov 13, 2015 16:54   Dg Ankle Complete Left  10/16/2015  CLINICAL DATA:  Tripped over curb twisting ankle.  Pain.  Swelling. EXAM: LEFT ANKLE COMPLETE - 3+ VIEW COMPARISON:  None. FINDINGS: There is a fracture through the lateral malleolus without significant displacement. No visible tibial fracture. Ankle mortise appears intact. Lateral soft tissue swelling. IMPRESSION: Essentially nondisplaced lateral malleolar fracture. Electronically Signed   By: Rolm Baptise M.D.   On: 10/16/2015 09:05   Dg Ankle Complete Right  10/16/2015  CLINICAL DATA:  Tripped over curb, twisting right ankle.  Swelling. EXAM: RIGHT ANKLE - COMPLETE 3+ VIEW COMPARISON:  None. FINDINGS: There is lateral soft tissue swelling. Nondisplaced fracture through the distal fibula at the level of the ankle mortise. Fractures through the base of the medial malleolus, posterior malleolus  and anterior tibia. Ankle mortise appears intact. IMPRESSION: Trimalleolar fracture. Fracture also noted through the anterior tibial corner. Electronically Signed   By: Rolm Baptise M.D.   On: 10/16/2015 09:04   Ct Ankle Right Wo Contrast  10/16/2015  CLINICAL DATA:  Twisting injury today. Evaluate trimalleolar fracture. EXAM: CT OF THE RIGHT ANKLE WITHOUT CONTRAST TECHNIQUE: Multidetector CT imaging of the right ankle was performed according to the standard protocol. Multiplanar CT image reconstructions were also generated. COMPARISON:  Radiograph same date. FINDINGS: As demonstrated radiographically, there is a mildly displaced trimalleolar fracture of the right ankle. The tibiotalar joint is widened anteriorly with an interposed fracture fragment measuring up to 10 mm in diameter. The fibular fracture is oblique, mildly comminuted and minimally displaced. There is an adjacent avulsion fracture of the anterolateral distal  tibia, likely mediated via the anterior inferior tibiofibular ligament. There is anterior displacement of a 14 mm fragment. The fracture of the posterior malleolus demonstrates up to 2 mm of displacement and 3 mm of proximal impaction. The medial malleolar component is comminuted with up to 6 mm of displacement. The fibular and posterior malleolar fractures extend into the distal tibiofibular articulation. The talar dome appears intact. There is no dislocation. The subtalar joint and visualized tarsal bones appear intact. As evaluated by CT, the ankle tendons appear intact without entrapment. There is soft tissue swelling around the ankle both medially and laterally. IMPRESSION: Mildly displaced trimalleolar fracture of the right ankle as described. The ankle joint is widened anteriorly with an interposed fracture fragment. The talar dome appears intact. Electronically Signed   By: Richardean Sale M.D.   On: 10/16/2015 10:54   Chest Portable 1 View  10/16/2015  CLINICAL DATA:  Hypertension  EXAM: PORTABLE CHEST 1 VIEW COMPARISON:  12/22/2010 FINDINGS: Cardiac shadow is mildly prominent accentuated by the portable technique. The lungs are clear bilaterally. No acute bony abnormality is seen. IMPRESSION: No acute abnormality Electronically Signed   By: Inez Catalina M.D.   On: 10/16/2015 11:12    Disposition: 01-Home or Self Care        Follow-up Information    Follow up with [facility] .   Specialty:  [place] information:   [place] [phone]      Follow up with MENZ,MICHAEL, MD In 12 days.   Specialty:  Orthopedic Surgery   Why:  For suture removal, For wound re-check   Contact information:   [place] [address] [phone]        Signed: Feliberto Gottron 10/18/2015, 10:48 PM

## 2015-10-18 NOTE — Care Management Important Message (Signed)
Important Message  Patient Details  Name: Sandra Holmes MRN: [id number] Date of Birth: 12-30-1945   Medicare Important Message Given:  Yes    Olegario Messier A Yardley Beltran 10/18/2015, 12:13 PM

## 2015-10-18 NOTE — Progress Notes (Signed)
Physical Therapy Treatment Patient Details Name: Sandra Holmes MRN: [id number] DOB: 15-Mar-1946 Today's Date: 10/18/2015    History of Present Illness Patient is a 70 y.o. female presents with bilateral ankle pain. Onset of symptoms was sudden this morning when she slipped at [facility] coming off a curb     with unchanged course since that time. The pain is located on the outside of the left ankle and the inside and outside of the right ankle. Patient describes the pain as severe continuous and rated as severe. she was evaluated emergency room admitted for treatment of the right trimalleolar ankle fracture, left lateral malleolus fracture is nondisplaced. Past history includesdiabetes treated with oral medication neuropathy hypertension hypercholesterolemia anemia. Previous studies include ankle x-ray and CT right ankle.    PT Comments    Pt demonstrates increased pain and weakness in LLE in standing and with attempted ambulation. Again she requires maxA+2 to get from chair back to bed. She is able to complete all seated and supine exercises as instructed. Pt will benefit from skilled PT services to address deficits in strength, balance, and mobility in order to return to full function at home.   Follow Up Recommendations  SNF     Equipment Recommendations  Rolling walker with 5" wheels (TBD at SNF)    Recommendations for Other Services Rehab consult     Precautions / Restrictions Precautions Precautions: Fall Restrictions Weight Bearing Restrictions: Yes RLE Weight Bearing: Non weight bearing LLE Weight Bearing: Weight bearing as tolerated    Mobility  Bed Mobility Overal bed mobility: Needs Assistance Bed Mobility: Sit to Supine     Supine to sit: Min assist     General bed mobility comments: Pt with improved hip flexion strength but still needs minA for LEs. Received upright in recliner. Reports increase in pain with all mobility  Transfers Overall transfer level:  Needs assistance Equipment used: Rolling walker (2 wheeled) Transfers: Sit to/from Stand Sit to Stand: +2 physical assistance;Min assist         General transfer comment: Improving sequencing with transfers but still painful to bear weight through LLE. Pt requires cues for fully upright posture once in standing. Able to remain standing with CGA only  Ambulation/Gait Ambulation/Gait assistance: Mod assist;Max assist;+2 physical assistance Ambulation Distance (Feet): 2 Feet Assistive device: Rolling walker (2 wheeled)   Gait velocity: Decreased   General Gait Details: Attempted hopping gait with rolling walker and NWB on RLE during session. Pt exhibits same response during PM session with increase in pain with all WB on LLE. Pt with LE buckling due to pain and poor balance with ambulation. Starts with modA+2 but progresses to maxA+2 due to LLE weakness and pain. SaO2 stays around 90% on room air   Stairs            Wheelchair Mobility    Modified Rankin (Stroke Patients Only)       Balance Overall balance assessment: Needs assistance Sitting-balance support: No upper extremity supported Sitting balance-Leahy Scale: Good     Standing balance support: Bilateral upper extremity supported Standing balance-Leahy Scale: Poor                      Cognition Arousal/Alertness: Awake/alert Behavior During Therapy: WFL for tasks assessed/performed Overall Cognitive Status: Within Functional Limits for tasks assessed                      Exercises General Exercises - Lower Extremity Long  Arc Quad: Strengthening;Both;Seated;15 reps Hip ABduction/ADduction: Strengthening;Both;Supine;15 reps (x 15 seated as well) Straight Leg Raises: Strengthening;Both;10 reps;Supine Hip Flexion/Marching: Strengthening;Both;15 reps;Seated    General Comments        Pertinent Vitals/Pain Pain Assessment: 0-10 Pain Score: 8  Pain Location: Right ankle, 2/10 L ankle Pain  Intervention(s): Premedicated before session;Monitored during session;Limited activity within patient's tolerance    Home Living                      Prior Function            PT Goals (current goals can now be found in the care plan section) Acute Rehab PT Goals Patient Stated Goal: Return to prior level of function at home PT Goal Formulation: With patient/family Time For Goal Achievement: 11/01/15 Potential to Achieve Goals: Good Progress towards PT goals: Progressing toward goals    Frequency  BID    PT Plan Current plan remains appropriate    Co-evaluation             End of Session Equipment Utilized During Treatment: Gait belt Activity Tolerance: Patient tolerated treatment well Patient left: with call bell/phone within reach;in bed;with bed alarm set (bilateral feet elevated with pillows)     Time: 1610-9604 PT Time Calculation (min) (ACUTE ONLY): 25 min  Charges:  $Gait Training: 8-22 mins $Therapeutic Exercise: 8-22 mins                    G Codes:      Sharalyn Ink Huprich PT, DPT   Huprich,Jason 10/18/2015, 3:48 PM

## 2015-10-19 ENCOUNTER — Encounter: Payer: Self-pay | Admitting: Orthopedic Surgery

## 2015-10-19 NOTE — Clinical Social Work Placement (Signed)
   CLINICAL SOCIAL WORK PLACEMENT  NOTE  Date:  10/19/2015  Patient Details  Name: Sandra Holmes MRN: [id number] Date of Birth: [birth year]  Clinical Social Work is seeking post-discharge placement for this patient at the Skilled  Nursing Facility level of care (*CSW will initial, date and re-position this form in  chart as items are completed):  Yes   Patient/family provided with [facility] Clinical Social Work Department's list of facilities offering this level of care within the geographic area requested by the patient (or if unable, by the patient's family).  Yes   Patient/family informed of their freedom to choose among providers that offer the needed level of care, that participate in Medicare, Medicaid or managed care program needed by the patient, have an available bed and are willing to accept the patient.  Yes   Patient/family informed of [facility]'s ownership interest in [facility] and [facility], as well as of the fact that they are under no obligation to receive care at these facilities.  PASRR submitted to EDS on 10/18/15     PASRR number received on 10/18/15     Existing PASRR number confirmed on       FL2 transmitted to all facilities in geographic area requested by pt/family on 10/18/15     FL2 transmitted to all facilities within larger geographic area on       Patient informed that his/her managed care company has contracts with or will negotiate with certain facilities, including the following:        Yes   Patient/family informed of bed offers received.  Patient chooses bed at  [facility] )     Physician recommends and patient chooses bed at      Patient to be transferred to  [facility] ) on 10/19/15.  Patient to be transferred to facility by  [facility] )     Patient family notified on 10/19/15 of transfer.  Name of family member notified:   (Patient's friend Sandra Bible is at bedside and aware of D/C today. CSW left  patient's husband Sandra Holmes a voicemail making him aware of above.  )     PHYSICIAN       Additional Comment:    _______________________________________________ Haig Prophet, LCSW 10/19/2015, 9:07 AM

## 2015-10-19 NOTE — Progress Notes (Signed)
Pt d/c'd to [facility] this AM. Transported by EMS via Doctor, general practice. Husband accompanied. Report called to Amy, RN.

## 2015-10-19 NOTE — Progress Notes (Signed)
   Subjective: 2 Days Post-Op Procedure(s) (LRB): OPEN REDUCTION INTERNAL FIXATION (ORIF) ANKLE FRACTURE (Right) Patient reports pain as mild.   Patient is well, and has had no acute complaints or problems Denies any CP, SOB, ABD pain. We will continue therapy today.  Plan is to go Skilled nursing facility after hospital stay.  Objective: Vital signs in last 24 hours: Temp:  [98.1 F (36.7 C)-99.1 F (37.3 C)] 99.1 F (37.3 C) (02/17 0727) Pulse Rate:  [93-101] 97 (02/17 0727) Resp:  [16-21] 16 (02/17 0727) BP: (106-132)/(39-49) 111/42 mmHg (02/17 0727) SpO2:  [90 %-92 %] 90 % (02/17 0727)  Intake/Output from previous day: 02/16 0701 - 02/17 0700 In: 900 [P.O.:900] Out: 700 [Urine:700] Intake/Output this shift:     Recent Labs  10/16/15 0959 10/17/15 1859  HGB 9.9* 10.2*    Recent Labs  10/16/15 0959 10/17/15 1859  WBC 6.5 9.9  RBC 3.84 3.81  HCT 30.9* 30.8*  PLT 283 279    Recent Labs  10/16/15 0959 10/17/15 1859  NA 139  --   K 4.9  --   CL 101  --   CO2 30  --   BUN 15  --   CREATININE 0.87 0.91  GLUCOSE 124*  --   CALCIUM 9.1  --    No results for input(s): LABPT, INR in the last 72 hours.  EXAM General - Patient is Alert, Appropriate and Oriented Extremity - Neurovascular intact Sensation intact distally Intact pulses distally Dressing - dressing C/D/I and no drainage, short leg cast applied today Motor Function - intact, moving toes well on exam.   Past Medical History  Diagnosis Date  . Diabetes mellitus without complication (HCC)     Assessment/Plan:   2 Days Post-Op Procedure(s) (LRB): OPEN REDUCTION INTERNAL FIXATION (ORIF) ANKLE FRACTURE (Right) Active Problems:   Bilateral ankle fractures  Estimated body mass index is 25.73 kg/(m^2) as calculated from the following:   Height as of this encounter:  (1.626 m).   Weight as of this encounter: 68.04 kg (150 lb). Advance diet Up with therapy, NWB RLE, WBAT LLE Follow up  with [facility] in 12 days for cast removal and xrays.   DVT Prophylaxis - Lovenox, discharge to rehab with aspirin Weight-Bearing as tolerated to left leg, NWB RLE D/C O2 and Pulse OX and try on Room Air  T. Cranston Neighbor, PA-C [facility] 10/19/2015, 8:25 AM

## 2015-10-19 NOTE — Progress Notes (Signed)
Patient is medically stable for D/C to [facility] today. Per [facility] admissions coordinator at [facility] patient will go to room 509. RN will call report to 500 hall and arrange EMS for transport. Clinical Child psychotherapist (CSW) sent D/C Summary, FL2, and D/C Packet to [employer] via [employer]. Patient is aware of above. Patient's friend Dennie Bible is at bedside and aware of above. Patient reported that her husband would be at [facility] around 10 or 10:30 this morning. CSW left patient's husband Gery Pray a voicemail making him aware of above. Please reconsult if future social work needs arise. CSW signing off.   Jetta Lout, LCSW [phone]

## 2015-10-19 NOTE — Progress Notes (Signed)
Physical Therapy Treatment Patient Details Name: JOZLYN SCHATZ MRN: [id number] DOB: 10/20/1945 Today's Date: 10/19/2015    History of Present Illness Patient is a 70 y.o. female presents with bilateral ankle pain. Onset of symptoms was sudden this morning when she slipped at [facility] coming off a curb     with unchanged course since that time. The pain is located on the outside of the left ankle and the inside and outside of the right ankle. Patient describes the pain as severe continuous and rated as severe. she was evaluated emergency room admitted for treatment of the right trimalleolar ankle fracture, left lateral malleolus fracture is nondisplaced. Past history includesdiabetes treated with oral medication neuropathy hypertension hypercholesterolemia anemia. Previous studies include ankle x-ray and CT right ankle.    PT Comments    Pt is scheduled to go to rehab today, she is reporting significant pain in the R ankle and requests not to try getting up today.  She has pain with even very slow, gentle PROM movement of the R LE and pt generally hesitant and guarded.  Pt is able to tolerate some manual resistance WBing through the L LE and ultimately shows some improved tolerance/movement on that side.   Follow Up Recommendations  SNF     Equipment Recommendations       Recommendations for Other Services       Precautions / Restrictions Precautions Precautions: Fall Restrictions RLE Weight Bearing: Non weight bearing LLE Weight Bearing: Weight bearing as tolerated    Mobility  Bed Mobility               General bed mobility comments: Pt requesting to defer any mobiltiy today as she is leaving   Transfers                    Ambulation/Gait                 Stairs            Wheelchair Mobility    Modified Rankin (Stroke Patients Only)       Balance                                    Cognition Arousal/Alertness:  Awake/alert Behavior During Therapy: WFL for tasks assessed/performed Overall Cognitive Status: Within Functional Limits for tasks assessed                      Exercises General Exercises - Lower Extremity Ankle Circles/Pumps: Strengthening;10 reps;Both;AROM ("resisted" DF on L) Quad Sets: Strengthening;Both;10 reps;Supine Gluteal Sets: Strengthening;Both;10 reps;Supine Short Arc Quad: Strengthening;Both;10 reps;Supine Heel Slides: Left;Strengthening;10 reps Hip ABduction/ADduction: Strengthening;Both;Supine;15 reps    General Comments        Pertinent Vitals/Pain Pain Score: 9     Home Living                      Prior Function            PT Goals (current goals can now be found in the care plan section) Progress towards PT goals:  (pt is pain limited, shows good effort )    Frequency  BID    PT Plan Current plan remains appropriate    Co-evaluation             End of Session   Activity Tolerance: Patient limited by pain Patient left: with  family/visitor present;with bed alarm set;with call bell/phone within reach     Time: 1013-1031 PT Time Calculation (min) (ACUTE ONLY): 18 min  Charges:  $Therapeutic Exercise: 8-22 mins                    G Codes:     Loran Senters, PT, DPT [phone]  Malachi Pro 10/19/2015, 11:32 AM

## 2015-10-30 ENCOUNTER — Emergency Department
Admission: EM | Admit: 2015-10-30 | Discharge: 2015-12-01 | Disposition: E | Payer: Medicare Other | Attending: Emergency Medicine | Admitting: Emergency Medicine

## 2015-10-30 DIAGNOSIS — E119 Type 2 diabetes mellitus without complications: Secondary | ICD-10-CM | POA: Diagnosis not present

## 2015-10-30 DIAGNOSIS — I469 Cardiac arrest, cause unspecified: Secondary | ICD-10-CM | POA: Diagnosis present

## 2015-10-30 MED ORDER — TENECTEPLASE 50 MG IV KIT: 35.0000 mg | PACK | Freq: Once | INTRAVENOUS | Status: DC

## 2015-10-30 MED ORDER — TENECTEPLASE 50 MG IV KIT
PACK | INTRAVENOUS | Status: AC
  Filled 2015-10-30: qty 10

## 2015-10-30 MED ORDER — EPINEPHRINE HCL 0.1 MG/ML IJ SOSY
PREFILLED_SYRINGE | INTRAMUSCULAR | Status: DC | PRN
  Administered 2015-10-30 (×5): 1 mg via INTRAVENOUS

## 2015-10-30 NOTE — Code Documentation (Signed)
Organ procurement team notified.

## 2015-10-30 NOTE — Code Documentation (Signed)
Family (Husband) in Family Room, Dr. Mayford Knife and Charge RN, Sue Lush to family room for report/decision by husband.

## 2015-10-30 NOTE — Code Documentation (Signed)
Pulse Check by Dr. Mayford Knife and RN, No pulse, CPR restarted

## 2015-10-30 NOTE — Code Documentation (Signed)
Family (Husband) at bedside.

## 2015-10-30 NOTE — Code Documentation (Signed)
Pulse check by RN & Dr. Mayford Knife, No Pulse, Ultrasound used, No pulse, CPR restarted

## 2015-10-30 NOTE — ED Provider Notes (Addendum)
[facility] Emergency Department Provider Note     Time seen: ----------------------------------------- 10:36 PM on 10/20/2015 -----------------------------------------  L5 caveat: Review of systems and history cannot be obtained. Patient is unresponsive  I have reviewed the triage vital signs and the nursing notes.   HISTORY  Chief Complaint No chief complaint on file.    HPI Sandra Holmes is a 70 y.o. female who presents being brought to ER after cardiopulmonary arrest from the nursing home.Husband states she had recently been complaining of shortness of breath. She had a witnessed arrest at the nursing home.   Past Medical History  Diagnosis Date  . Diabetes mellitus without complication [facility])     Patient Active Problem List   Diagnosis Date Noted  . Bilateral ankle fractures 10/16/2015    Past Surgical History  Procedure Laterality Date  . Abdominal hysterectomy    . Breast surgery    . Colonoscopy with propofol N/A 03/26/2015    Procedure: COLONOSCOPY WITH PROPOFOL;  Surgeon: Elnita Maxwell, MD;  Location: [facility] ENDOSCOPY;  Service: Endoscopy;  Laterality: N/A;  . Breast biopsy Right 5+ yrs ago    excisional - neg  . Orif ankle fracture Right 10/17/2015    Procedure: OPEN REDUCTION INTERNAL FIXATION (ORIF) ANKLE FRACTURE;  Surgeon: Kennedy Bucker, MD;  Location: [facility] ORS;  Service: Orthopedics;  Laterality: Right;    Allergies Review of patient's allergies indicates no known allergies.  Social History Social History  Substance Use Topics  . Smoking status: Never Smoker   . Smokeless tobacco: Not on file  . Alcohol Use: No    Review of Systems Positive for shortness of breath, otherwise unknown  ____________________________________________   PHYSICAL EXAM:  VITAL SIGNS: ED Triage Vitals  Enc Vitals Group     BP --      Pulse --      Resp --      Temp --      Temp src --      SpO2 --      Weight --      Height  --      Head Cir --      Peak Flow --      Pain Score --      Pain Loc --      Pain Edu? --      Excl. in GC? --     Constitutional: Patient is unresponsive Eyes: Pupils fixed and dilated bilaterally ENT   Head: Normocephalic and atraumatic.   Nose: No congestion/rhinnorhea.   Mouth/Throat: Mucous membranes are moist.   Neck: No stridor. Cardiovascular: Heart beat is absent Respiratory: Breath sounds are symmetric with King airway in place, mild rales bilaterally Gastrointestinal: Nondistended. Musculoskeletal: There is a cast on the right lower extremity. No obvious swelling is noted. Neurologic:  GCS is 3T Skin:  Skin is warm, dry and intact. No rash noted. ____________________________________________  ED COURSE:  Pertinent labs & imaging results that were available during my care of the patient were reviewed by me and considered in my medical decision making (see chart for details). Patient presents asystolic with chest compressions and King airway in place. Reported transient return of spontaneous circulation in route. We will continue to attempt resuscitation  CENTRAL LINE Performed by: Daryel November E Consent: The procedure was performed in an emergent situation. Required items: required blood products, implants, devices, and special equipment available Patient identity confirmed: arm band and provided demographic data Time out: Immediately prior to procedure  a "time out" was called to  verify the correct patient, procedure, equipment, support staff and site/side marked as required. Indications: vascular access Patient sedated: no Preparation: skin prepped with 2% chlorhexidine Skin prep agent dried: skin prep agent completely dried prior to procedure Hand hygiene: hand hygiene performed prior to central venous catheter insertion  Location details: Right femoral   Catheter type: Cordis  Pre-procedure: landmarks identified Successful placement: yes,  heavy resistance.  Post-procedure: line sutured and dressing applied Assessment: blood return through all parts, free fluid flow, placement verified by x-ray and no pneumothorax on x-ray Patient tolerance: Patient tolerated the procedure well with no immediate complications. ____________________________________________  FINAL ASSESSMENT AND PLAN  Cardiopulmonary arrest  Plan: Patient with a spinal arrest. I suspect a large PE. Place a central line in the right femoral vein which is same size as her previous trimalleolar fracture. There was significant resistance and the line initially flowed well but seemed to quickly clot off. Patient was also having frank blood come from the [facility] airway. These findings are suggestive of a large pulmonary embolus. Husband was invited into the room during resuscitative attempts. Time of death was called after approximately 9 mg of epinephrine, CPR, ventilation and central line placement.   Emily Filbert, MD  Patient will be in an me case due to accidental death. Emily Filbert, MD 10/07/2015 2240  Emily Filbert, MD 10/29/2015 [phone]

## 2015-10-30 NOTE — Code Documentation (Signed)
Femoral Line Placed by Dr. Mayford Knife in Right Side.

## 2015-10-30 NOTE — Code Documentation (Signed)
Pulse check by RN & Dr. Mayford Knife, No Pulse, CPR restarted

## 2015-10-31 MED FILL — Medication: Qty: 1 | Status: AC

## 2015-10-31 DEATH — deceased

## 2015-12-01 DEATH — deceased

## 2016-03-06 IMAGING — CT CT ANKLE*R* W/O CM
2 series · 10 of 14 positions shown, 11 images · non-contrast
Comparison: Radiograph same date.

CLINICAL DATA: Twisting injury today. Evaluate trimalleolar
fracture.

EXAM:
CT OF THE RIGHT ANKLE WITHOUT CONTRAST
TECHNIQUE: Multidetector CT imaging of the right ankle was performed according
to the standard protocol. Multiplanar CT image reconstructions were
also generated.

[Series 3: ankle · axial · 0.28mm/px · z∈[-128,-16]mm · 6 of 107 slices shown]
[im 16/107  bone]
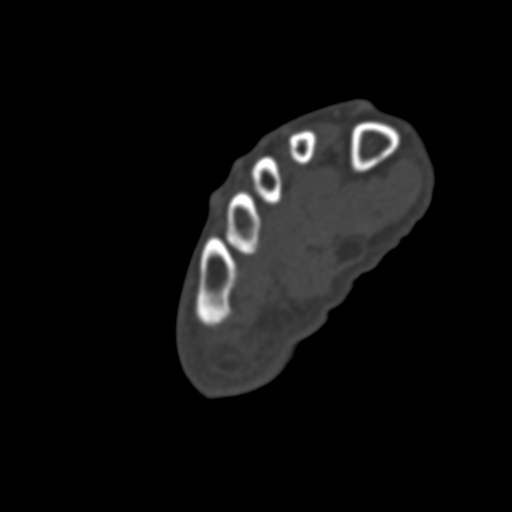
[im 31/107  bone]
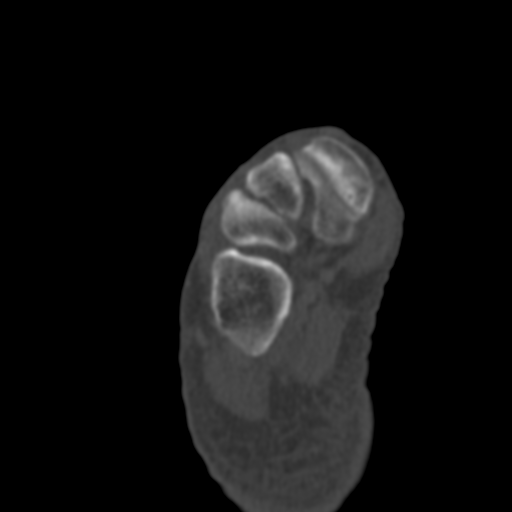
[im 46/107  bone]
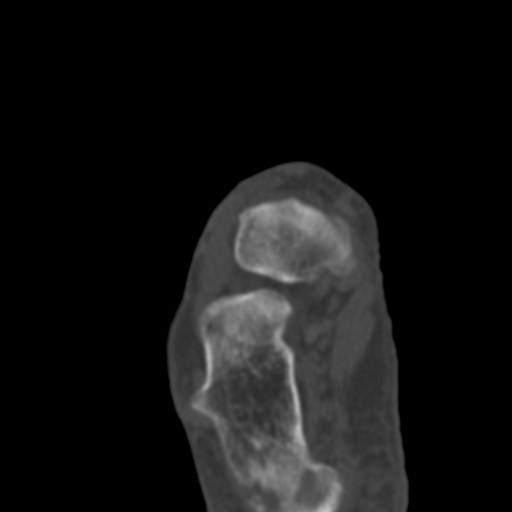
[im 61/107  bone]
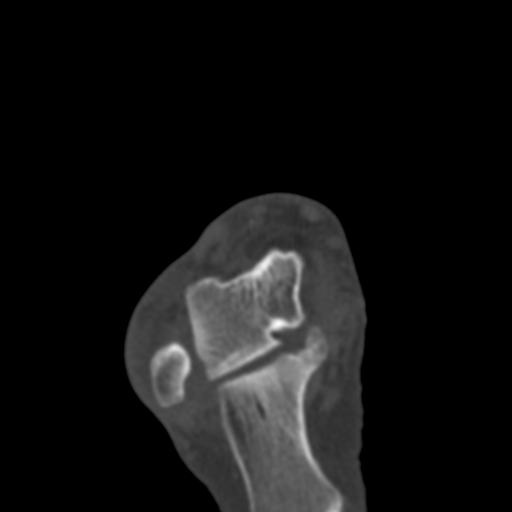
[im 76/107  bone]
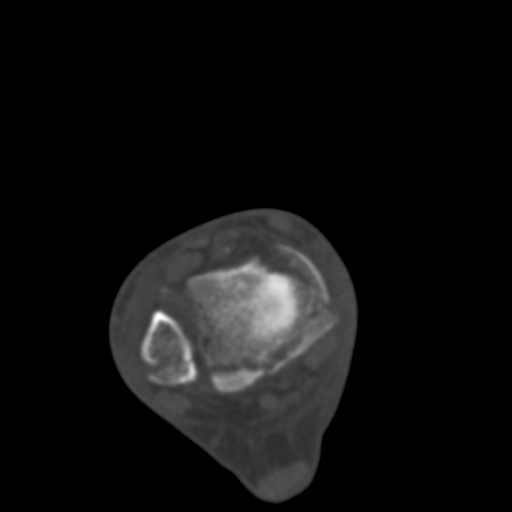
[im 91/107  bone]
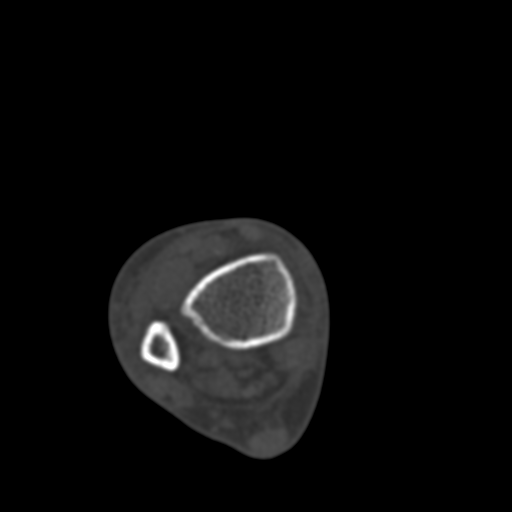

[Series 4: ankle bone · axial · 0.28mm/px · z∈[-150,-71]mm · 4 of 75 slices shown, 5 images]
[im 15/75  soft-tissue]
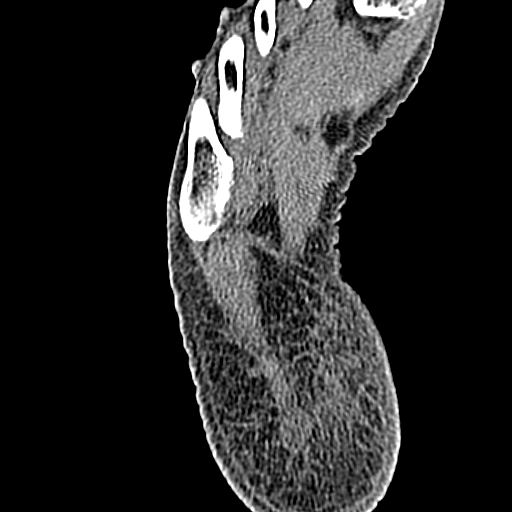
[im 15/75  bone]
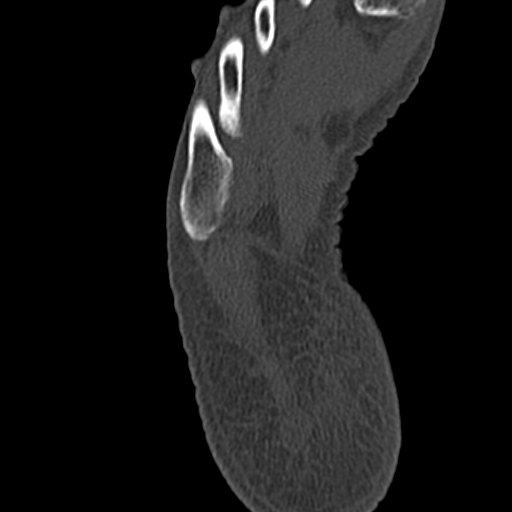
[im 30/75  bone]
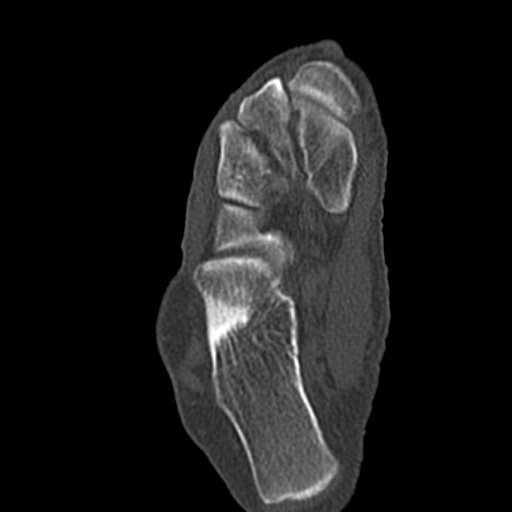
[im 45/75  bone]
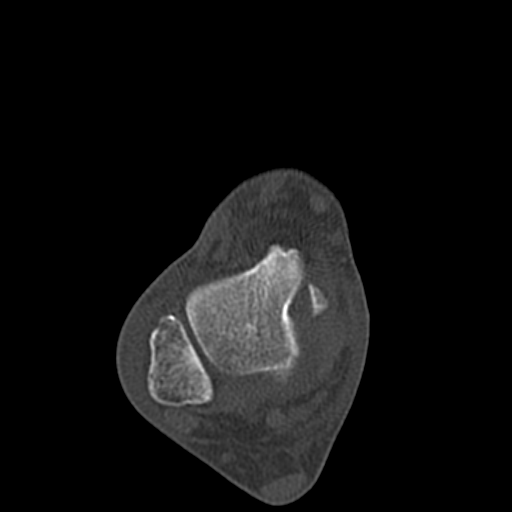
[im 60/75  bone]
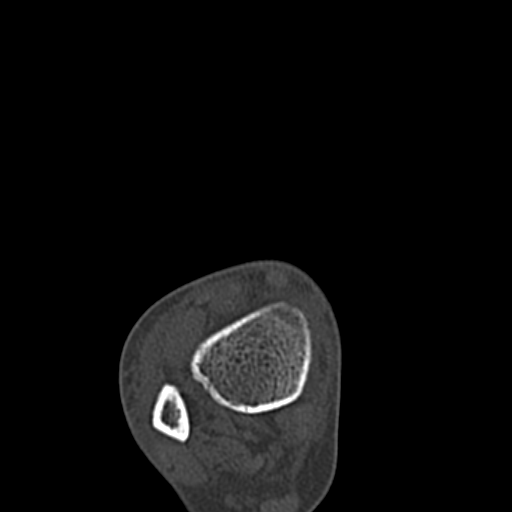

[10 of 14 positions shown; findings below may reference images not displayed]

FINDINGS: As demonstrated radiographically, there is a mildly displaced
trimalleolar fracture of the right ankle. The tibiotalar joint is
widened anteriorly with an interposed fracture fragment measuring up
to 10 mm in diameter.

The fibular fracture is oblique, mildly comminuted and minimally
displaced.

There is an adjacent avulsion fracture of the anterolateral distal
tibia, likely mediated via the anterior inferior tibiofibular
ligament. There is anterior displacement of a 14 mm fragment. The
fracture of the posterior malleolus demonstrates up to 2 mm of
displacement and 3 mm of proximal impaction. The medial malleolar
component is comminuted with up to 6 mm of displacement. The fibular
and posterior malleolar fractures extend into the distal
tibiofibular articulation.

The talar dome appears intact. There is no dislocation. The subtalar
joint and visualized tarsal bones appear intact.

As evaluated by CT, the ankle tendons appear intact without
entrapment. There is soft tissue swelling around the ankle both
medially and laterally.
IMPRESSION: Mildly displaced trimalleolar fracture of the right ankle as
described. The ankle joint is widened anteriorly with an interposed
fracture fragment. The talar dome appears intact.
